# Patient Record
Sex: Female | Born: 1960
Health system: Southern US, Community
[De-identification: ages and names within clinical notes are randomized; demographics above are authoritative.]

## PROBLEM LIST (undated history)

## (undated) DIAGNOSIS — M069 Rheumatoid arthritis, unspecified: Secondary | ICD-10-CM

## (undated) DIAGNOSIS — H409 Unspecified glaucoma: Secondary | ICD-10-CM

## (undated) DIAGNOSIS — G43909 Migraine, unspecified, not intractable, without status migrainosus: Secondary | ICD-10-CM

## (undated) DIAGNOSIS — J309 Allergic rhinitis, unspecified: Secondary | ICD-10-CM

## (undated) DIAGNOSIS — J45909 Unspecified asthma, uncomplicated: Secondary | ICD-10-CM

## (undated) HISTORY — DX: Unspecified glaucoma: H40.9

## (undated) HISTORY — DX: Unspecified asthma, uncomplicated: J45.909

## (undated) HISTORY — PX: APPENDECTOMY: SHX54

## (undated) HISTORY — PX: TONSILLECTOMY: SUR1361

## (undated) HISTORY — DX: Rheumatoid arthritis, unspecified: M06.9

## (undated) HISTORY — DX: Migraine, unspecified, not intractable, without status migrainosus: G43.909

## (undated) HISTORY — DX: Allergic rhinitis, unspecified: J30.9

---

## 2008-12-31 HISTORY — PX: GASTRIC BYPASS: SHX52

## 2009-03-03 ENCOUNTER — Encounter (HOSPITAL_COMMUNITY): Admission: RE | Admit: 2009-03-03 | Discharge: 2009-05-05 | Payer: Self-pay | Admitting: Neurology

## 2009-03-16 ENCOUNTER — Ambulatory Visit (HOSPITAL_COMMUNITY): Admission: RE | Admit: 2009-03-16 | Discharge: 2009-03-16 | Payer: Self-pay | Admitting: General Surgery

## 2009-03-22 ENCOUNTER — Encounter: Admission: RE | Admit: 2009-03-22 | Discharge: 2009-06-20 | Payer: Self-pay | Admitting: General Surgery

## 2009-06-07 ENCOUNTER — Inpatient Hospital Stay (HOSPITAL_COMMUNITY): Admission: RE | Admit: 2009-06-07 | Discharge: 2009-06-09 | Payer: Self-pay | Admitting: General Surgery

## 2009-06-08 ENCOUNTER — Encounter (INDEPENDENT_AMBULATORY_CARE_PROVIDER_SITE_OTHER): Payer: Self-pay | Admitting: General Surgery

## 2009-06-08 ENCOUNTER — Ambulatory Visit: Payer: Self-pay | Admitting: Vascular Surgery

## 2009-08-17 ENCOUNTER — Encounter: Admission: RE | Admit: 2009-08-17 | Discharge: 2009-11-15 | Payer: Self-pay | Admitting: General Surgery

## 2009-12-05 ENCOUNTER — Ambulatory Visit (HOSPITAL_COMMUNITY): Admission: RE | Admit: 2009-12-05 | Discharge: 2009-12-06 | Payer: Self-pay | Admitting: Neurosurgery

## 2010-06-12 IMAGING — CR DG UGI W/ GASTROGRAFIN
2 series · 2 of 2 positions shown · IV contrast (agent unspecified)
Comparison: None

CLINICAL DATA: Postop gastric bypass.

WATER SOLUBLE UPPER GI SERIES
TECHNIQUE: Single-column upper GI series was performed using water
soluble contrast.
Fluoroscopy Time: 2.6 minutes.
Contrast: 50 ml [id number]

[view not recorded (1 of 2)]
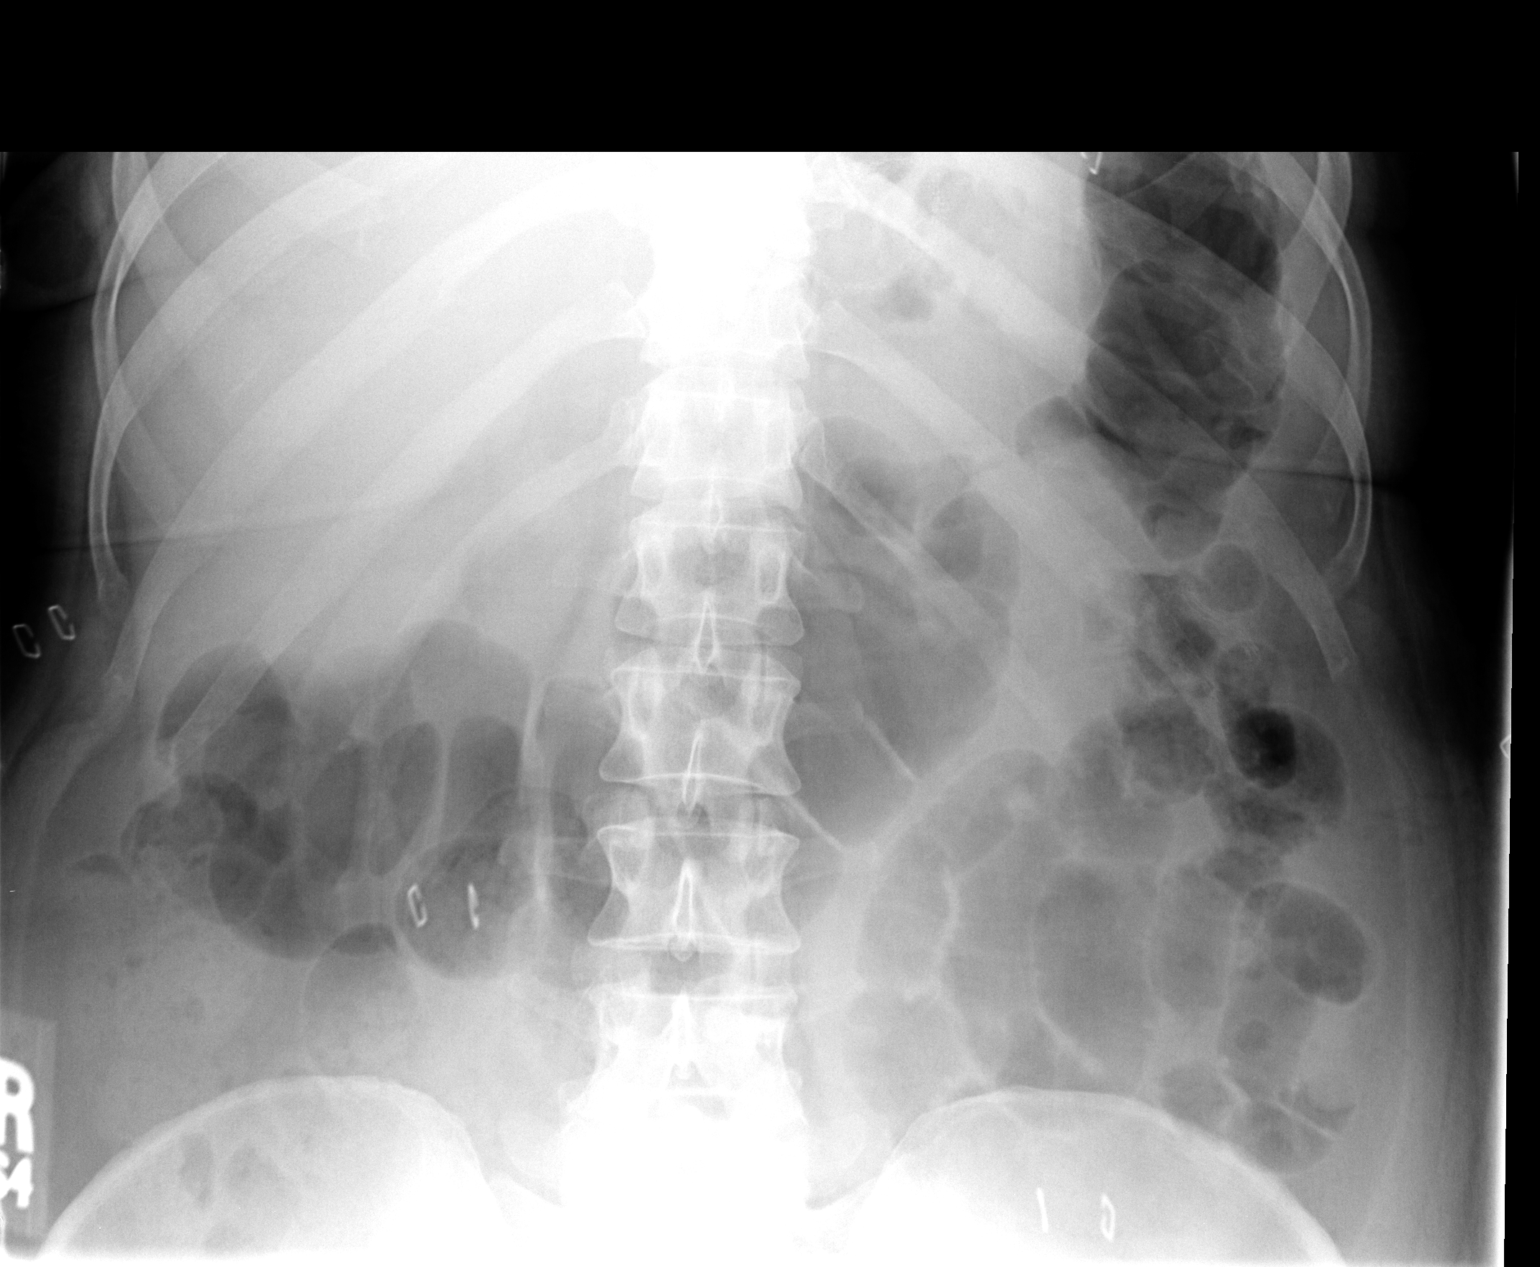

[view not recorded (2 of 2)]
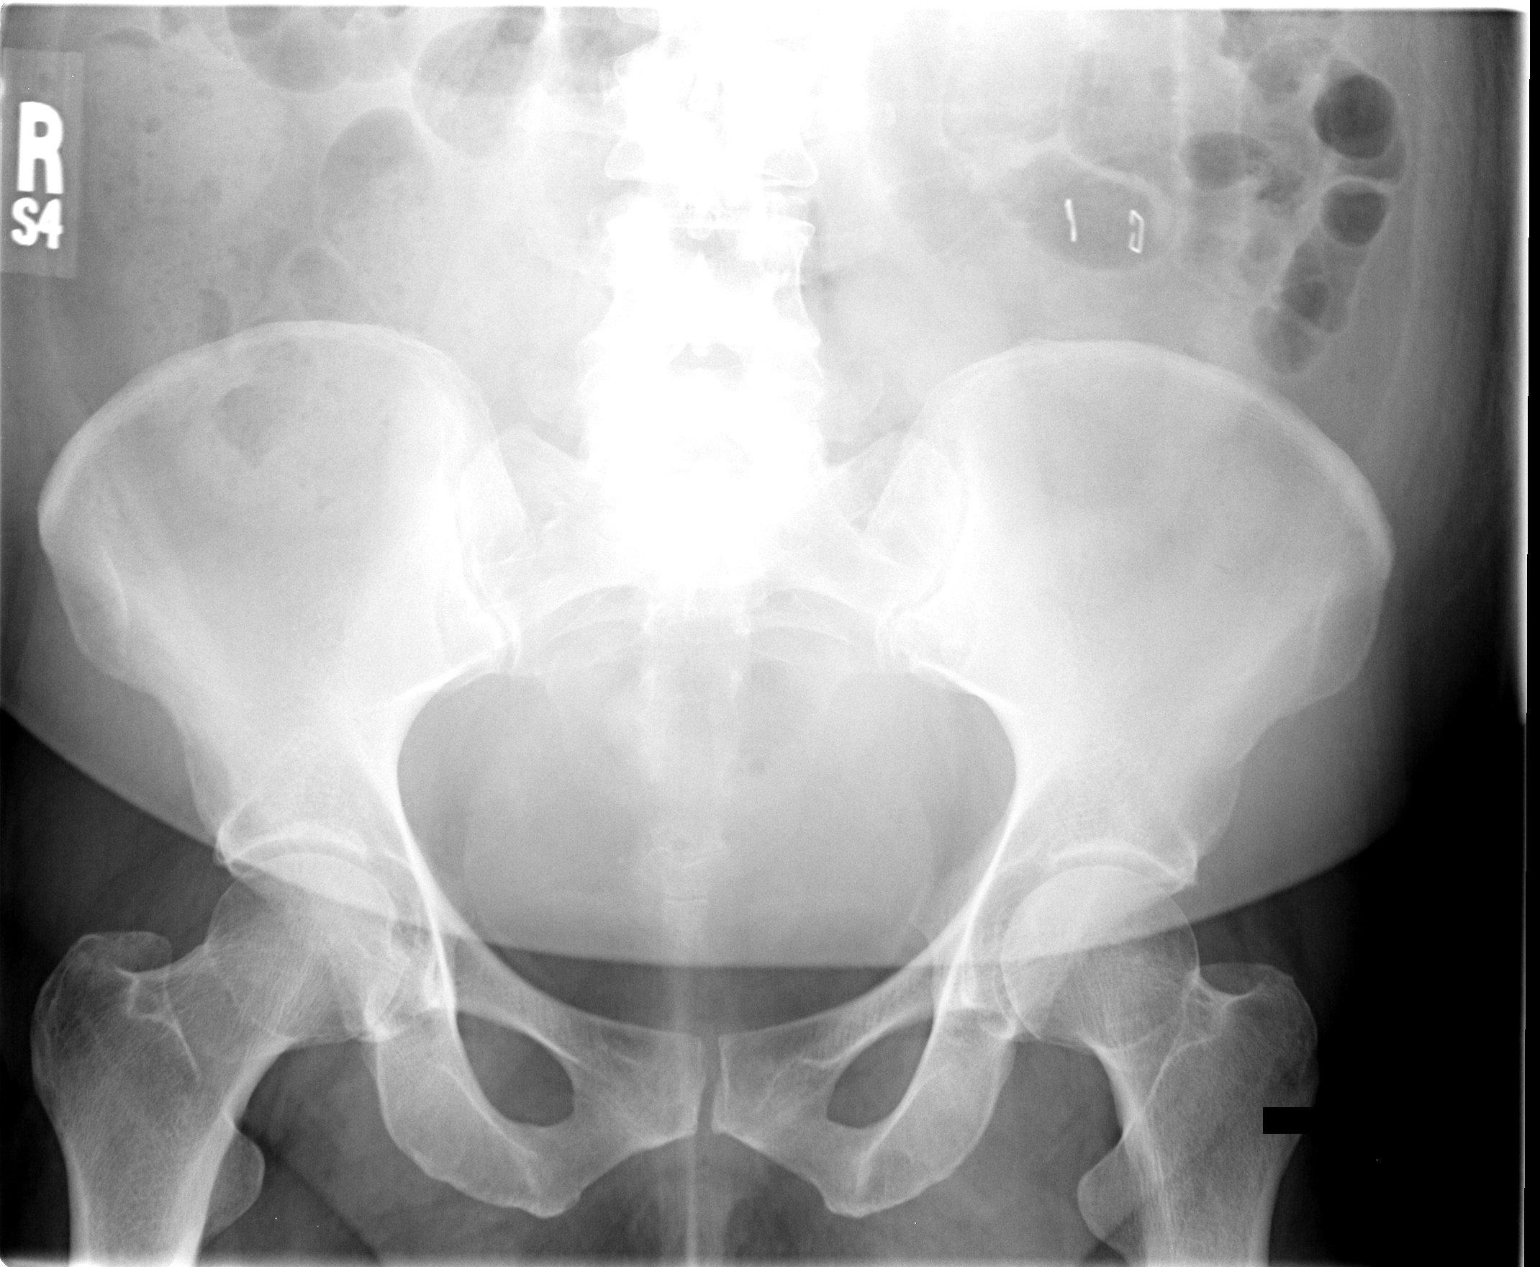

[2 of 2 positions shown; findings below may reference images not displayed]

FINDINGS: Scout film of the abdomen shows mild gaseous distention
of the colon.  Gas in nondistended small bowel.

Following ingestion of water soluble contrast, a small gastric
pouch is identified.  There is prompt emptying into small bowel.
The proximal anastomosis is unremarkable.  No evidence of leak.
Contrast was followed distally in the small bowel into the left
upper pelvis.  No obstruction or leak distally.
IMPRESSION: Unremarkable postop gastric bypass appearance.

## 2011-01-21 ENCOUNTER — Encounter: Payer: Self-pay | Admitting: General Surgery

## 2011-04-03 LAB — CBC
HCT: 39.8 % (ref 36.0–46.0)
Hemoglobin: 13.6 g/dL (ref 12.0–15.0)
MCHC: 34.3 g/dL (ref 30.0–36.0)
MCV: 92.8 fL (ref 78.0–100.0)
Platelets: 271 10*3/uL (ref 150–400)
RBC: 4.28 MIL/uL (ref 3.87–5.11)
RDW: 13.5 % (ref 11.5–15.5)
WBC: 8.2 10*3/uL (ref 4.0–10.5)

## 2011-04-03 LAB — BASIC METABOLIC PANEL
BUN: 11 mg/dL (ref 6–23)
CO2: 26 mEq/L (ref 19–32)
Calcium: 9.4 mg/dL (ref 8.4–10.5)
Chloride: 107 mEq/L (ref 96–112)
Creatinine, Ser: 0.77 mg/dL (ref 0.4–1.2)
GFR calc non Af Amer: >60 mL/min (ref >60)
Glucose, Bld: 100 mg/dL — ABNORMAL HIGH (ref 70–99)
Potassium: 4.4 mEq/L (ref 3.5–5.1)
Sodium: 140 mEq/L (ref 135–145)

## 2011-04-09 LAB — CBC
HCT: 37.2 % (ref 36.0–46.0)
HCT: 39.3 % (ref 36.0–46.0)
HCT: 40.3 % (ref 36.0–46.0)
Hemoglobin: 12.7 g/dL (ref 12.0–15.0)
Hemoglobin: 13.3 g/dL (ref 12.0–15.0)
Hemoglobin: 13.6 g/dL (ref 12.0–15.0)
MCHC: 33.6 g/dL (ref 30.0–36.0)
MCHC: 33.8 g/dL (ref 30.0–36.0)
MCHC: 34 g/dL (ref 30.0–36.0)
MCV: 88.2 fL (ref 78.0–100.0)
MCV: 89.6 fL (ref 78.0–100.0)
MCV: 89.9 fL (ref 78.0–100.0)
Platelets: 238 10*3/uL (ref 150–400)
Platelets: 292 10*3/uL (ref 150–400)
Platelets: 296 10*3/uL (ref 150–400)
RBC: 4.15 MIL/uL (ref 3.87–5.11)
RBC: 4.37 MIL/uL (ref 3.87–5.11)
RBC: 4.57 MIL/uL (ref 3.87–5.11)
RDW: 14.7 % (ref 11.5–15.5)
RDW: 14.9 % (ref 11.5–15.5)
RDW: 15.7 % — ABNORMAL HIGH (ref 11.5–15.5)
WBC: 11.1 10*3/uL — ABNORMAL HIGH (ref 4.0–10.5)
WBC: 15.5 10*3/uL — ABNORMAL HIGH (ref 4.0–10.5)
WBC: 8.4 10*3/uL (ref 4.0–10.5)

## 2011-04-09 LAB — COMPREHENSIVE METABOLIC PANEL
ALT: 21 U/L (ref 0–35)
AST: 36 U/L (ref 0–37)
Albumin: 4.1 g/dL (ref 3.5–5.2)
Alkaline Phosphatase: 81 U/L (ref 39–117)
BUN: 15 mg/dL (ref 6–23)
CO2: 25 mEq/L (ref 19–32)
Calcium: 9.4 mg/dL (ref 8.4–10.5)
Chloride: 107 mEq/L (ref 96–112)
Creatinine, Ser: 0.74 mg/dL (ref 0.4–1.2)
GFR calc non Af Amer: >60 mL/min (ref >60)
Glucose, Bld: 105 mg/dL — ABNORMAL HIGH (ref 70–99)
Potassium: 4.3 mEq/L (ref 3.5–5.1)
Sodium: 139 mEq/L (ref 135–145)
Total Bilirubin: 0.6 mg/dL (ref 0.3–1.2)
Total Protein: 6.9 g/dL (ref 6.0–8.3)

## 2011-04-09 LAB — DIFFERENTIAL
Basophils Absolute: 0 10*3/uL (ref 0.0–0.1)
Basophils Absolute: 0 10*3/uL (ref 0.0–0.1)
Basophils Absolute: 0 10*3/uL (ref 0.0–0.1)
Basophils Relative: 0 % (ref 0–1)
Basophils Relative: 0 % (ref 0–1)
Basophils Relative: 0 % (ref 0–1)
Eosinophils Absolute: 0 10*3/uL (ref 0.0–0.7)
Eosinophils Absolute: 0.1 10*3/uL (ref 0.0–0.7)
Eosinophils Absolute: 0.1 10*3/uL (ref 0.0–0.7)
Eosinophils Relative: 0 % (ref 0–5)
Eosinophils Relative: 1 % (ref 0–5)
Eosinophils Relative: 1 % (ref 0–5)
Lymphocytes Relative: 20 % (ref 12–46)
Lymphocytes Relative: 25 % (ref 12–46)
Lymphocytes Relative: 8 % — ABNORMAL LOW (ref 12–46)
Lymphs Abs: 1.2 10*3/uL (ref 0.7–4.0)
Lymphs Abs: 2.1 10*3/uL (ref 0.7–4.0)
Lymphs Abs: 2.2 10*3/uL (ref 0.7–4.0)
Monocytes Absolute: 0.7 10*3/uL (ref 0.1–1.0)
Monocytes Absolute: 1 10*3/uL (ref 0.1–1.0)
Monocytes Absolute: 1.2 10*3/uL — ABNORMAL HIGH (ref 0.1–1.0)
Monocytes Relative: 8 % (ref 3–12)
Monocytes Relative: 9 % (ref 3–12)
Monocytes Relative: 9 % (ref 3–12)
Neutro Abs: 13.1 10*3/uL — ABNORMAL HIGH (ref 1.7–7.7)
Neutro Abs: 5.5 10*3/uL (ref 1.7–7.7)
Neutro Abs: 7.7 10*3/uL (ref 1.7–7.7)
Neutrophils Relative %: 65 % (ref 43–77)
Neutrophils Relative %: 70 % (ref 43–77)
Neutrophils Relative %: 84 % — ABNORMAL HIGH (ref 43–77)

## 2011-04-09 LAB — HEMOGLOBIN AND HEMATOCRIT, BLOOD
HCT: 37.7 % (ref 36.0–46.0)
HCT: 38.6 % (ref 36.0–46.0)
Hemoglobin: 12.8 g/dL (ref 12.0–15.0)
Hemoglobin: 13.2 g/dL (ref 12.0–15.0)

## 2011-04-09 LAB — PREGNANCY, URINE

## 2011-05-15 NOTE — Op Note (Signed)
[name], Susan Castillo             ACCOUNT NO.:  [id number]   MEDICAL RECORD NO.:  [id number]          PATIENT TYPE:  INP   LOCATION:  0002                         FACILITY:  [facility]   PHYSICIAN:  Sandria Bales. Ezzard Standing, M.D.  DATE OF BIRTH:  Aug 24, 1961   DATE OF PROCEDURE:  06/07/2009  DATE OF DISCHARGE:                               OPERATIVE REPORT   Date of Surgery - 07 June 2009   PREOPERATIVE DIAGNOSIS:  Status post Roux-en-Y gastric bypass.   POSTOPERATIVE DIAGNOSIS:  Status post Roux-en-Y gastric bypass.   PROCEDURE:  Upper esophagogastrostomy.   SURGEON:  Dr. Ezzard Standing.   FIRST ASSISTANT:  None.   ANESTHESIA:  General endotracheal.   BLOOD LOSS:  Minimal.   DESCRIPTION OF PROCEDURE:  [name] is a 50 year old white female with  a BMI of approximately 25 who has undergone a Roux-en-Y gastric bypass  by Dr. Jaclynn Guarneri.  I am doing an upper endoscopy for documentation of  the anastomosis and viability of the pouch.   OPERATIVE NOTE:  With the patient in a supine position under general anesthesia, Dr.  Johna Sheriff manning the laparoscopic, had the small bowel clamped off.  I  passed the Olympus gastroscope down to her gastric pouch without  difficulty.   I identified her esophagogastric junction at about 38 cm.  I identified  the gastrojejunal anastomosis at about 43 cm for an approximately 5-cm  pouch.  The mucosa was viable.  The anastomosis was wide open.  There  was no bleeding.  It was felt to be a normal-appearing pouch and  anastomosis.  Dr. Johna Sheriff inflowed the upper abdomen with saline  looking for bubbles and he saw none.   The scope was then withdrawn.  The esophagus was otherwise unremarkable.  The patient tolerated the procedure well.  Dr. Johna Sheriff will dictate the  Roux-en-Y gastric bypass.      Sandria Bales. Ezzard Standing, M.D.  Electronically Signed     DHN/MEDQ  D:  06/07/2009  T:  06/07/2009  Job:  [id number]   cc:   Lorne Skeens. Hoxworth, M.D.  [address], [address]  [place]  [place] [address]

## 2011-05-15 NOTE — Op Note (Signed)
Susan Castillo, Susan Castillo             ACCOUNT NO.:  [id number]   MEDICAL RECORD NO.:  [id number]          PATIENT TYPE:  INP   LOCATION:  1531                         FACILITY:  [facility]   PHYSICIAN:  Sharlet Salina T. Hoxworth, M.D.DATE OF BIRTH:  11/13/1961   DATE OF PROCEDURE:  06/07/2009  DATE OF DISCHARGE:                               OPERATIVE REPORT   PREOPERATIVE DIAGNOSIS:  Morbid obesity.   POSTOPERATIVE DIAGNOSIS:  Morbid obesity.   SURGICAL PROCEDURE:  Laparoscopic Roux-en-Y gastric bypass.   SURGEON:  Lorne Skeens. Hoxworth, M.D.   ASSISTANT:  Sandria Bales. Ezzard Standing, M.D.   ANESTHESIA:  General.   BRIEF HISTORY:  Ms. Utley is a 50 year old female with progressive  morbid obesity unresponsive to medical management who presents at a BMI  of 35 with comorbidities of hypertension and asthma felt to be weight-  related and GERD.  After extensive discussion of options, detailed  elsewhere we elected proceed with laparoscopic Roux-en-Y gastric bypass  for treatment of her morbid obesity.  She is now brought to the  operating room for this procedure.   DESCRIPTION OF OPERATION:  The patient was brought to operating room,  placed in the supine position on the operating room table and general  endotracheal anesthesia was induced.  The patient received preoperative  IV antibiotics.  PAS were placed.  Lovenox was given subcutaneously.  The abdomen was widely sterilely prepped and draped.  The correct  patient and procedure were verified.  Access was obtained with an 11 mm  OptiView trocar in the left subcostal space without difficulty and  pneumoperitoneum established.  Under direct vision a 12 mm trocar was  placed well laterally in the right upper quadrant, another 12 mm trocar  in the right upper quadrant midclavicular line, another 11 mm trocar  just above and to the left of the umbilicus for the camera port and a 5  mm trocar in the left flank.  The omentum and transverse colon were  elevated.  The ligament Treitz clearly identified.  A 40 cm afferent  limb was then carefully measured and at this point, the small intestine  was divided with a single firing of the 45 mm white load stapler.  The  mesentery was further divided a short distance with the Harmonic  scalpel.  A Penrose drain was sutured to the end of the Roux limb to  identify it.  A 100 cm Roux limb was then carefully measured.  At this  point, a side-to-side anastomosis was created between the afferent limb  and the Roux limb with a single firing of the white load 45 mm stapler.  The staple line was intact without bleeding.  The common enterotomy was  closed with a running 2-0 Vicryl started from either end and  tied  centrally.  The mesenteric defect was then closed with a running 2-0  silk.  Tisseel tissue sealant was used to coat the suture and staple  lines.  Following this, the [facility] retractor was placed through a 5  mm subxiphoid port and the left lobe of liver elevated with excellent  exposure of the stomach  and hiatus.  The angle of His was mobilized with  the Harmonic scalpel.  A 4-5 cm gastric pouch was measured along the  lesser curve.  At this point, the peritoneum along the lesser curve was  dissected along the gastric wall back toward the lesser sac with the  Harmonic scalpel with careful blunt dissection and the lesser sac was  entered.  All tubes were carefully removed from the stomach.  The  initial firing of the gold 60 mm stapler about 3 cm across the lesser  curve was performed.  Further dissection was carried back up toward the  angle of His.  Another firing of the 60 mm blue load stapler up toward  the angle of His almost completed the pouch with a nice tubular 4-5 cm  pouch and a final firing of the blue 45 of mm stapler up through the  angle of His completely separated the pouch from the distal remnant.  The staple line of the distal remnant was oversewn with a running 2-0  silk.   The Roux limb was then brought up in antecolic fashion with the  end of the candy cane facing to the patient's left.  The  gastrojejunostomy was then created with initial posterior row of running  of 2-0 Vicryl.  The Ewald inserted down into the pouch.  Enterotomies  were made in the pouch and the Roux limb with the Harmonic scalpel and a  anastomosis about 2 cm in length was created with a single firing of the  linear 45 mm blue load stapler.  The staple line was intact and without  bleeding.  The common enterotomy was then closed from either end with  running 2-0 Vicryl.  The Ewald tube was inserted down through the  anastomosis and an anterior seromuscular suture of running 2-0 Vicryl  was performed.  The anastomosis appeared nicely patent under no tension  with excellent blood supply.  The Vonita Moss defect was then closed  suturing the edge of the Roux mesentery to the transverse mesocolon with  running 2-0 silk.  Following this, Sandria Bales. Ezzard Standing, M.D. proceeded with  upper endoscopy and with the outlet of the pouch clamped and under  saline irrigation the pouch was tensely distended with air.  There was  no evidence of leak.  The pouch was desufflated.  It was then irrigated  and there was no evidence of bleeding or trocar injury.  Tisseel was  used to coat the suture staple lines of the gastrojejunostomy.  The  Nathanson retractor was removed and all CO2 evacuated.  Trocars were  removed.  The skin incisions were closed with staples.  Sponge, needle  and instrument counts were correct.  The patient was taken to recovery  in good condition.      Lorne Skeens. Hoxworth, M.D.  Electronically Signed     BTH/MEDQ  D:  06/07/2009  T:  06/07/2009  Job:  [id number]

## 2011-11-26 ENCOUNTER — Telehealth (INDEPENDENT_AMBULATORY_CARE_PROVIDER_SITE_OTHER): Payer: Self-pay | Admitting: General Surgery

## 2011-11-26 NOTE — Telephone Encounter (Signed)
11/26/11 recall letter mailed to patient for bariatric surgery follow-up. Adv pt to call [facility] to schedule an appt..cef °

## 2013-01-01 ENCOUNTER — Telehealth (INDEPENDENT_AMBULATORY_CARE_PROVIDER_SITE_OTHER): Payer: Self-pay | Admitting: General Surgery

## 2013-01-01 NOTE — Telephone Encounter (Signed)
12/02/12 mailed recall letter for bariatric surgery follow-up to pt. Advised pt to call [facility] at [phone] to °schedule appt. (lss) ° °

## 2013-01-13 DIAGNOSIS — M5417 Radiculopathy, lumbosacral region: Secondary | ICD-10-CM | POA: Insufficient documentation

## 2013-01-13 DIAGNOSIS — G894 Chronic pain syndrome: Secondary | ICD-10-CM | POA: Insufficient documentation

## 2013-01-13 DIAGNOSIS — M5136 Other intervertebral disc degeneration, lumbar region: Secondary | ICD-10-CM | POA: Insufficient documentation

## 2013-07-14 DIAGNOSIS — M5412 Radiculopathy, cervical region: Secondary | ICD-10-CM | POA: Insufficient documentation

## 2016-01-04 ENCOUNTER — Other Ambulatory Visit: Payer: Self-pay | Admitting: *Deleted

## 2016-01-04 MED ORDER — CYPROHEPTADINE HCL 4 MG PO TABS: ORAL_TABLET | ORAL | Status: DC

## 2016-04-16 ENCOUNTER — Other Ambulatory Visit: Payer: Self-pay | Admitting: *Deleted

## 2016-04-16 MED ORDER — BUDESONIDE-FORMOTEROL FUMARATE 160-4.5 MCG/ACT IN AERO: 2.0000 | INHALATION_SPRAY | Freq: Two times a day (BID) | RESPIRATORY_TRACT | Status: DC

## 2016-04-16 NOTE — Telephone Encounter (Signed)
ONE REFILL SYMBICORT SENT TO [facility]

## 2016-04-16 NOTE — Telephone Encounter (Signed)
PT NEEDS MEDICATION REFILL FOR SYMBICORT. PHARMACY IS [facility]. HAS AN APPT ON MAY 1ST.

## 2016-04-16 NOTE — Telephone Encounter (Signed)
DENIED REFILL SYMBICORT TO [facility] PT NEEDS APPT

## 2016-04-30 ENCOUNTER — Encounter: Payer: Self-pay | Admitting: Allergy and Immunology

## 2016-04-30 ENCOUNTER — Ambulatory Visit (INDEPENDENT_AMBULATORY_CARE_PROVIDER_SITE_OTHER): Payer: BLUE CROSS/BLUE SHIELD | Admitting: Allergy and Immunology

## 2016-04-30 VITALS — BP 128/80 | HR 72 | Resp 12 | Ht 65.0 in | Wt 185.0 lb

## 2016-04-30 DIAGNOSIS — G43109 Migraine with aura, not intractable, without status migrainosus: Secondary | ICD-10-CM

## 2016-04-30 DIAGNOSIS — J4540 Moderate persistent asthma, uncomplicated: Secondary | ICD-10-CM | POA: Diagnosis not present

## 2016-04-30 DIAGNOSIS — H1010 Acute atopic conjunctivitis, unspecified eye: Secondary | ICD-10-CM

## 2016-04-30 DIAGNOSIS — G43909 Migraine, unspecified, not intractable, without status migrainosus: Secondary | ICD-10-CM

## 2016-04-30 DIAGNOSIS — J309 Allergic rhinitis, unspecified: Secondary | ICD-10-CM

## 2016-04-30 MED ORDER — MOMETASONE FUROATE 50 MCG/ACT NA SUSP: NASAL | Status: DC

## 2016-04-30 MED ORDER — BUDESONIDE-FORMOTEROL FUMARATE 160-4.5 MCG/ACT IN AERO: INHALATION_SPRAY | RESPIRATORY_TRACT | Status: DC

## 2016-04-30 MED ORDER — CYPROHEPTADINE HCL 4 MG PO TABS: ORAL_TABLET | ORAL | Status: DC

## 2016-04-30 MED ORDER — ALBUTEROL SULFATE HFA 108 (90 BASE) MCG/ACT IN AERS: INHALATION_SPRAY | RESPIRATORY_TRACT | Status: DC

## 2016-04-30 NOTE — Progress Notes (Signed)
Follow-up Note  Referring Provider: No ref. provider found Primary Provider: Angelina Sheriff., MD Date of Office Visit: 04/30/2016  Subjective:   Susan Castillo (DOB: Sep 14, 1961) is a 55 y.o. female who returns to the [facility] on 04/30/2016 in re-evaluation of the following:  HPI: Chemise returns to this clinic in reevaluation of her asthma and allergic rhinitis and migraine headaches. I last saw her in his clinic in May 2016.   Concerning her asthma, she is done very well while consistently using her Symbicort. During those times of the year she will use her Symbicort one time per day but over the course of the past month she's been using it twice a day. Her bronchodilator requirement is approximately twice a week and she has no nocturnal bronchospastic symptoms. She is not required a systemic steroid in the past year to treat this condition. She did obtain the flu vaccine  Her nose is been doing quite well. She no longer needs to use any Nasonex. She's not had any episodes of sinusitis.  Her sleep is been going well but her cyproheptadine does not appear to be working as well regarding her migraine headaches. She'll wake up several days a week with a headache in the morning. She does not have any snoring nor does she have any symptoms suggest apnea. She is not sleepy during the daytime. She would like to increase her dose of cyproheptadine.    Medication List           budesonide-formoterol 160-4.5 MCG/ACT inhaler  Commonly known as:  SYMBICORT  Inhale 2 puffs into the lungs 2 (two) times daily.     CALCIUM + D PO  Take by mouth.     cyproheptadine 4 MG tablet  Commonly known as:  PERIACTIN  Take 1/2 - 1 tablet every evening as directed     DULoxetine 60 MG capsule  Commonly known as:  CYMBALTA  TK 1 C PO QD     mometasone 50 MCG/ACT nasal spray  Commonly known as:  NASONEX  Place 1 spray into the nose daily.     MULTIVITAMIN ADULT PO  Take  by mouth.     PROAIR HFA 108 (90 Base) MCG/ACT inhaler  Generic drug:  albuterol  Inhale 2 puffs into the lungs every 4 (four) hours as needed for wheezing or shortness of breath.     topiramate 50 MG tablet  Commonly known as:  TOPAMAX  TK 1 T PO BID        Past Medical History  Diagnosis Date  . Asthma     Past Surgical History  Procedure Laterality Date  . Gastric bypass  2010  . Tonsillectomy      Allergies  Allergen Reactions  . Codeine Nausea And Vomiting    Review of systems negative except as noted in HPI / PMHx or noted below:  Review of Systems  Constitutional: Negative.   HENT: Negative.   Eyes: Negative.   Respiratory: Negative.   Cardiovascular: Negative.   Gastrointestinal: Negative.   Genitourinary: Negative.   Musculoskeletal: Negative.   Skin: Negative.   Neurological: Negative.   Endo/Heme/Allergies: Negative.   Psychiatric/Behavioral: Negative.      Objective:   Filed Vitals:   04/30/16 1619  BP: 128/80  Pulse: 72  Resp: 12   Height: 5\' 5"  (165.1 cm)  Weight: 185 lb (83.915 kg)   Physical Exam  Constitutional: She is well-developed, well-nourished, and in no distress.  HENT:  Head: Normocephalic.  Right Ear: Tympanic membrane, external ear and ear canal normal.  Left Ear: Tympanic membrane, external ear and ear canal normal.  Nose: Nose normal. No mucosal edema or rhinorrhea.  Mouth/Throat: Uvula is midline, oropharynx is clear and moist and mucous membranes are normal. No oropharyngeal exudate.  Hearing aids  Eyes: Conjunctivae are normal.  Neck: Trachea normal. No tracheal tenderness present. No tracheal deviation present. No thyromegaly present.  Cardiovascular: Normal rate, regular rhythm, S1 normal, S2 normal and normal heart sounds.   No murmur heard. Pulmonary/Chest: Breath sounds normal. No stridor. No respiratory distress. She has no wheezes. She has no rales.  Musculoskeletal: She exhibits no edema.    Lymphadenopathy:       Head (right side): No tonsillar adenopathy present.       Head (left side): No tonsillar adenopathy present.    She has no cervical adenopathy.  Neurological: She is alert. Gait normal.  Skin: No rash noted. She is not diaphoretic. No erythema. Nails show no clubbing.  Psychiatric: Mood and affect normal.    Diagnostics:    Spirometry was performed and demonstrated an FEV1 of 2.71 at 97 % of predicted.  The patient had an Asthma Control Test with the following results: ACT Total Score: 19.    Assessment and Plan:   1. Asthma, moderate persistent, well-controlled   2. Allergic rhinoconjunctivitis   3. Migraine syndrome     1. Increase Periactin to 6 mg bedtime (1-1/2 tablets)  2. Continue Symbicort 160 - 2 inhalations twice a day  3. Continue Nasonex one spray each nostril once a day  4. Continue ProAir HFA and antihistamine if needed  5. Return to clinic in 6 months or earlier if problem  Overall Adilen respiratory tract has been doing quite well and I see no need for changing her medical therapy at this point in time. However, she has had a few more headaches recently. I'll assume that this is still a migraine syndrome and treat her with an increased dose of Periactin and she will contact me over the course the next several weeks noting her response to this approach. If she still continues to have headaches and she will need to go see a neurologist. If she does well I will see her back in this clinic in 6 months.  Allena Katz, MD [place]

## 2016-04-30 NOTE — Patient Instructions (Addendum)
  1. Increase Periactin to 6 mg bedtime (1-1/2 tablets)  2. Continue Symbicort 160 - 2 inhalations twice a day  3. Continue Nasonex one spray each nostril once a day  4. Continue ProAir HFA and antihistamine if needed  5. Return to clinic in 6 months or earlier if problem

## 2016-05-17 ENCOUNTER — Other Ambulatory Visit: Payer: Self-pay | Admitting: *Deleted

## 2016-05-17 MED ORDER — BUDESONIDE-FORMOTEROL FUMARATE 160-4.5 MCG/ACT IN AERO: INHALATION_SPRAY | RESPIRATORY_TRACT | Status: DC

## 2016-05-25 ENCOUNTER — Other Ambulatory Visit: Payer: Self-pay | Admitting: *Deleted

## 2016-05-25 MED ORDER — CYPROHEPTADINE HCL 4 MG PO TABS: ORAL_TABLET | ORAL | Status: DC

## 2016-10-09 ENCOUNTER — Other Ambulatory Visit: Payer: Self-pay | Admitting: Allergy and Immunology

## 2016-11-05 ENCOUNTER — Ambulatory Visit: Payer: BLUE CROSS/BLUE SHIELD | Admitting: Allergy and Immunology

## 2016-11-12 DIAGNOSIS — Z79899 Other long term (current) drug therapy: Secondary | ICD-10-CM | POA: Diagnosis not present

## 2016-11-12 DIAGNOSIS — Z01419 Encounter for gynecological examination (general) (routine) without abnormal findings: Secondary | ICD-10-CM | POA: Diagnosis not present

## 2016-11-12 DIAGNOSIS — Z0000 Encounter for general adult medical examination without abnormal findings: Secondary | ICD-10-CM | POA: Diagnosis not present

## 2016-11-12 DIAGNOSIS — Z23 Encounter for immunization: Secondary | ICD-10-CM | POA: Diagnosis not present

## 2016-11-19 ENCOUNTER — Encounter (HOSPITAL_COMMUNITY): Payer: Self-pay

## 2016-12-09 ENCOUNTER — Other Ambulatory Visit: Payer: Self-pay | Admitting: Allergy and Immunology

## 2016-12-13 DIAGNOSIS — L703 Acne tropica: Secondary | ICD-10-CM | POA: Diagnosis not present

## 2016-12-17 DIAGNOSIS — H903 Sensorineural hearing loss, bilateral: Secondary | ICD-10-CM | POA: Diagnosis not present

## 2016-12-24 ENCOUNTER — Other Ambulatory Visit: Payer: Self-pay | Admitting: Allergy and Immunology

## 2017-01-07 ENCOUNTER — Ambulatory Visit (INDEPENDENT_AMBULATORY_CARE_PROVIDER_SITE_OTHER): Payer: BLUE CROSS/BLUE SHIELD | Admitting: Allergy and Immunology

## 2017-01-07 ENCOUNTER — Encounter: Payer: Self-pay | Admitting: Allergy and Immunology

## 2017-01-07 VITALS — BP 120/80 | HR 84 | Resp 16

## 2017-01-07 DIAGNOSIS — J309 Allergic rhinitis, unspecified: Secondary | ICD-10-CM

## 2017-01-07 DIAGNOSIS — J4540 Moderate persistent asthma, uncomplicated: Secondary | ICD-10-CM

## 2017-01-07 DIAGNOSIS — H1010 Acute atopic conjunctivitis, unspecified eye: Secondary | ICD-10-CM

## 2017-01-07 DIAGNOSIS — G43909 Migraine, unspecified, not intractable, without status migrainosus: Secondary | ICD-10-CM

## 2017-01-07 MED ORDER — BUDESONIDE-FORMOTEROL FUMARATE 160-4.5 MCG/ACT IN AERO: INHALATION_SPRAY | RESPIRATORY_TRACT | 5 refills | Status: DC

## 2017-01-07 MED ORDER — CYPROHEPTADINE HCL 4 MG PO TABS: ORAL_TABLET | ORAL | 5 refills | Status: DC

## 2017-01-07 MED ORDER — ALBUTEROL SULFATE HFA 108 (90 BASE) MCG/ACT IN AERS: INHALATION_SPRAY | RESPIRATORY_TRACT | 1 refills | Status: DC

## 2017-01-07 MED ORDER — MOMETASONE FUROATE 50 MCG/ACT NA SUSP: NASAL | 5 refills | Status: DC

## 2017-01-07 NOTE — Patient Instructions (Addendum)
  1. Continue Periactin to 6 mg bedtime (1-1/2 tablets)  2. Continue Symbicort 160 - 2 inhalations twice a day  3. Continue Nasonex one spray each nostril once a day  4. Continue ProAir HFA and antihistamine if needed  5. Return to clinic in 6 months or earlier if problem

## 2017-01-07 NOTE — Progress Notes (Signed)
Follow-up Note  Referring Provider: Angelina Sheriff, MD Primary Provider: Angelina Sheriff., MD Date of Office Visit: 01/07/2017  Subjective:   Susan Castillo (DOB: 29-Jan-1961) is a 56 y.o. female who returns to the [facility] on 01/07/2017 in re-evaluation of the following:  HPI: Susan Castillo returns to this clinic in reevaluation of her asthma and allergic rhinitis and migraine headaches. I last saw her in his clinic in May 2017.  During the interval her atopic respiratory disease has been under excellent control while using her Symbicort and Nasonex. She has not required a systemic steroid or antibiotics to treat her respiratory tract issue. She only uses a short-acting bronchodilator prior to exercise and cold.  Her headaches have been basically nonexistent while using 6 mg of Periactin every night.  She did obtain the flu vaccine this year.  Allergies as of 01/07/2017      Reactions   Codeine Nausea And Vomiting      Medication List      albuterol 108 (90 Base) MCG/ACT inhaler Commonly known as:  PROAIR HFA Inhale two puffs every 4-6 hours if needed for cough or wheeze   budesonide-formoterol 160-4.5 MCG/ACT inhaler Commonly known as:  SYMBICORT Inhale two puffs twice daily to prevent cough or wheeze. Rinse, gargle, and spit after use.   CALCIUM + D PO Take by mouth.   cyproheptadine 4 MG tablet Commonly known as:  PERIACTIN Take one and one half tablets daily as directed   DULoxetine 60 MG capsule Commonly known as:  CYMBALTA TK 1 C PO QD   mometasone 50 MCG/ACT nasal spray Commonly known as:  NASONEX Use one spray in each nostril once daily as directed   MULTIVITAMIN ADULT PO Take by mouth.   topiramate 50 MG tablet Commonly known as:  TOPAMAX TK 1 T PO BID       Past Medical History:  Diagnosis Date  . Allergic rhinitis   . Asthma     Past Surgical History:  Procedure Laterality Date  . GASTRIC BYPASS  2010  .  TONSILLECTOMY      Review of systems negative except as noted in HPI / PMHx or noted below:  Review of Systems  Constitutional: Negative.   HENT: Negative.   Eyes: Negative.   Respiratory: Negative.   Cardiovascular: Negative.   Gastrointestinal: Negative.   Genitourinary: Negative.   Musculoskeletal: Negative.   Skin: Negative.   Neurological: Negative.   Endo/Heme/Allergies: Negative.   Psychiatric/Behavioral: Negative.      Objective:   Vitals:   01/07/17 1732  BP: 120/80  Pulse: 84  Resp: 16          Physical Exam  Constitutional: She is well-developed, well-nourished, and in no distress.  HENT:  Head: Normocephalic.  Right Ear: Tympanic membrane, external ear and ear canal normal.  Left Ear: Tympanic membrane, external ear and ear canal normal.  Nose: Nose normal. No mucosal edema or rhinorrhea.  Mouth/Throat: Uvula is midline, oropharynx is clear and moist and mucous membranes are normal. No oropharyngeal exudate.  Eyes: Conjunctivae are normal.  Neck: Trachea normal. No tracheal tenderness present. No tracheal deviation present. No thyromegaly present.  Cardiovascular: Normal rate, regular rhythm, S1 normal, S2 normal and normal heart sounds.   No murmur heard. Pulmonary/Chest: Breath sounds normal. No stridor. No respiratory distress. She has no wheezes. She has no rales.  Musculoskeletal: She exhibits no edema.  Lymphadenopathy:       Head (  right side): No tonsillar adenopathy present.       Head (left side): No tonsillar adenopathy present.    She has no cervical adenopathy.  Neurological: She is alert. Gait normal.  Skin: No rash noted. She is not diaphoretic. No erythema. Nails show no clubbing.  Psychiatric: Mood and affect normal.    Diagnostics:    Spirometry was performed and demonstrated an FEV1 of 2.68 at 96 % of predicted.  The patient had an Asthma Control Test with the following results: ACT Total Score: 21.    Assessment and Plan:    1. Asthma, moderate persistent, well-controlled   2. Allergic rhinoconjunctivitis   3. Migraine syndrome     1. Continue Periactin to 6 mg bedtime (1-1/2 tablets)  2. Continue Symbicort 160 - 2 inhalations twice a day  3. Continue Nasonex one spray each nostril once a day  4. Continue ProAir HFA and antihistamine if needed  5. Return to clinic in 6 months or earlier if problem  Deron appears to be doing quite well regarding her atopic respiratory disease and her migraine headaches on her current medical therapy. She'll continue on this plan of anti-inflammatory medications for her respiratory tract and a preventative medication for her migraines and I will see her back in this clinic in 6 months or earlier if there is a problem.  Allena Katz, MD [place]

## 2017-01-10 DIAGNOSIS — K602 Anal fissure, unspecified: Secondary | ICD-10-CM | POA: Diagnosis not present

## 2017-04-10 DIAGNOSIS — F988 Other specified behavioral and emotional disorders with onset usually occurring in childhood and adolescence: Secondary | ICD-10-CM | POA: Diagnosis not present

## 2017-04-10 DIAGNOSIS — E7800 Pure hypercholesterolemia, unspecified: Secondary | ICD-10-CM | POA: Diagnosis not present

## 2017-04-10 DIAGNOSIS — Z6833 Body mass index (BMI) 33.0-33.9, adult: Secondary | ICD-10-CM | POA: Diagnosis not present

## 2017-04-10 DIAGNOSIS — Z79899 Other long term (current) drug therapy: Secondary | ICD-10-CM | POA: Diagnosis not present

## 2017-04-10 DIAGNOSIS — Z0000 Encounter for general adult medical examination without abnormal findings: Secondary | ICD-10-CM | POA: Diagnosis not present

## 2017-04-11 DIAGNOSIS — Z1231 Encounter for screening mammogram for malignant neoplasm of breast: Secondary | ICD-10-CM | POA: Diagnosis not present

## 2017-05-22 DIAGNOSIS — H40013 Open angle with borderline findings, low risk, bilateral: Secondary | ICD-10-CM | POA: Diagnosis not present

## 2017-05-22 DIAGNOSIS — H524 Presbyopia: Secondary | ICD-10-CM | POA: Diagnosis not present

## 2017-06-01 DIAGNOSIS — J019 Acute sinusitis, unspecified: Secondary | ICD-10-CM | POA: Diagnosis not present

## 2017-06-13 DIAGNOSIS — F331 Major depressive disorder, recurrent, moderate: Secondary | ICD-10-CM | POA: Diagnosis not present

## 2017-06-13 DIAGNOSIS — F419 Anxiety disorder, unspecified: Secondary | ICD-10-CM | POA: Diagnosis not present

## 2017-07-08 ENCOUNTER — Encounter: Payer: Self-pay | Admitting: Allergy and Immunology

## 2017-07-08 ENCOUNTER — Ambulatory Visit (INDEPENDENT_AMBULATORY_CARE_PROVIDER_SITE_OTHER): Payer: BLUE CROSS/BLUE SHIELD | Admitting: Allergy and Immunology

## 2017-07-08 VITALS — BP 120/72 | HR 82 | Resp 16 | Ht 64.0 in | Wt 195.0 lb

## 2017-07-08 DIAGNOSIS — L308 Other specified dermatitis: Secondary | ICD-10-CM

## 2017-07-08 DIAGNOSIS — J3089 Other allergic rhinitis: Secondary | ICD-10-CM

## 2017-07-08 DIAGNOSIS — G43909 Migraine, unspecified, not intractable, without status migrainosus: Secondary | ICD-10-CM | POA: Diagnosis not present

## 2017-07-08 DIAGNOSIS — J4540 Moderate persistent asthma, uncomplicated: Secondary | ICD-10-CM | POA: Diagnosis not present

## 2017-07-08 DIAGNOSIS — L989 Disorder of the skin and subcutaneous tissue, unspecified: Secondary | ICD-10-CM

## 2017-07-08 MED ORDER — MOMETASONE FUROATE 0.1 % EX OINT: TOPICAL_OINTMENT | Freq: Two times a day (BID) | CUTANEOUS | 5 refills | Status: DC | PRN

## 2017-07-08 NOTE — Progress Notes (Signed)
Follow-up Note  Referring Provider: Angelina Sheriff, MD Primary Provider: Angelina Sheriff, MD Date of Office Visit: 07/08/2017  Subjective:   Susan Castillo (DOB: 1961-10-03) is a 56 y.o. female who returns to the [facility] [place] on 07/08/2017 in re-evaluation of the following:  HPI: Susan Castillo returns to this clinic in reevaluation of her asthma and allergic rhinitis and migraine headache and a new onset dermatitis. I last saw her in this clinic January 2018.  Asthma has been under excellent control and she rarely uses a short acting bronchodilator and has not required the administration of systemic steroid for a exacerbation.  She has done very well with her nose and has not required an antibiotic up until May at which point in time she contracted an upper respiratory tract infection that was treated successfully with an antibiotic. Otherwise, she has no significant upper airway symptoms.  Headaches are under excellent control with Periactin 6 mg daily.  This past Saturday she developed a itchy dermatitis on her legs that was extremely red for which she went to the urgent care center and it was suggested that she use over-the-counter hydrocortisone which she has been doing 3 times per day and almost all the redness has resolved but has left behind a different type of rash. She has no associated systemic or constitutional symptoms or obvious trigger giving rise to this issue.  Allergies as of 07/08/2017      Reactions   Codeine Nausea And Vomiting      Medication List      albuterol 108 (90 Base) MCG/ACT inhaler Commonly known as:  PROAIR HFA Inhale two puffs every 4-6 hours if needed for cough or wheeze   budesonide-formoterol 160-4.5 MCG/ACT inhaler Commonly known as:  SYMBICORT Inhale two puffs twice daily to prevent cough or wheeze. Rinse, gargle, and spit after use.   CALCIUM + D PO Take by mouth.   cyproheptadine 4 MG tablet Commonly known as:   PERIACTIN Take one and one half tablets daily as directed   DULoxetine 60 MG capsule Commonly known as:  CYMBALTA Take 1 capsule by mouth every morning   DULoxetine 30 MG capsule Commonly known as:  CYMBALTA Take 30 mg by mouth at bedtime.   mometasone 50 MCG/ACT nasal spray Commonly known as:  NASONEX Use one spray in each nostril once daily as directed   MULTIVITAMIN ADULT PO Take by mouth.   topiramate 50 MG tablet Commonly known as:  TOPAMAX TK 1 T PO BID       Past Medical History:  Diagnosis Date  . Allergic rhinitis   . Asthma     Past Surgical History:  Procedure Laterality Date  . GASTRIC BYPASS  2010  . TONSILLECTOMY      Review of systems negative except as noted in HPI / PMHx or noted below:  Review of Systems  Constitutional: Negative.   HENT: Negative.   Eyes: Negative.   Respiratory: Negative.   Cardiovascular: Negative.   Gastrointestinal: Negative.   Genitourinary: Negative.   Musculoskeletal: Negative.   Skin: Negative.   Neurological: Negative.   Endo/Heme/Allergies: Negative.   Psychiatric/Behavioral: Negative.      Objective:   Vitals:   07/08/17 1708  BP: 120/72  Pulse: 82  Resp: 16   Height: 5\' 4"  (162.6 cm)  Weight: 195 lb (88.5 kg)   Physical Exam  Constitutional: She is well-developed, well-nourished, and in no distress.  HENT:  Head: Normocephalic.  Right Ear: Tympanic membrane, external ear and ear canal normal.  Left Ear: Tympanic membrane, external ear and ear canal normal.  Nose: Nose normal. No mucosal edema or rhinorrhea.  Mouth/Throat: Uvula is midline, oropharynx is clear and moist and mucous membranes are normal. No oropharyngeal exudate.  Eyes: Conjunctivae are normal.  Neck: Trachea normal. No tracheal tenderness present. No tracheal deviation present. No thyromegaly present.  Cardiovascular: Normal rate, regular rhythm, S1 normal, S2 normal and normal heart sounds.   No murmur heard. Pulmonary/Chest:  Breath sounds normal. No stridor. No respiratory distress. She has no wheezes. She has no rales.  Musculoskeletal: She exhibits no edema.  Lymphadenopathy:       Head (right side): No tonsillar adenopathy present.       Head (left side): No tonsillar adenopathy present.    She has no cervical adenopathy.  Neurological: She is alert. Gait normal.  Skin: Rash (multiple petechiae and lateral aspect of shins bilaterally. No induration. No erythema.) noted. She is not diaphoretic. No erythema. Nails show no clubbing.  Psychiatric: Mood and affect normal.    Diagnostics:    Spirometry was performed and demonstrated an FEV1 of 2.76 at 102 % of predicted.  The patient had an Asthma Control Test with the following results: ACT Total Score: 19.    Assessment and Plan:   1. Asthma, moderate persistent, well-controlled   2. Other allergic rhinitis   3. Migraine syndrome   4. Inflammatory dermatosis     1. Continue Periactin 6 mg bedtime (1-1/2 tablets)  2. Continue Symbicort 160 - 2 inhalations twice a day  3. Continue Nasonex one spray each nostril once a day  4. Continue ProAir HFA and antihistamine if needed  5. Start mometasone 0.1% ointment to rash two times a day until resolved  6. Return to clinic in 6 months or earlier if problem  7. Obtain fall flu vaccine  [name] appears to be doing very well regarding her respiratory tract and her headaches and we will keep her on the plan mentioned above. I'm going to have her use a moderate potency topical steroid to help with her remaining inflammation affecting her lower extremities. I think the petechiae are probably a sign that she abraded this area pretty significantly when it was itchy. The cause of her inflammatory dermatitis is unknown but I'm not going to have her undergo any further evaluation at this point concerning this issue unless of course it is recurrent. I will see her back in this clinic in 6 months or earlier if there is  a problem.  Allena Katz, MD Allergy / Immunology [place]

## 2017-07-08 NOTE — Patient Instructions (Addendum)
  1. Continue Periactin 6 mg bedtime (1-1/2 tablets)  2. Continue Symbicort 160 - 2 inhalations twice a day  3. Continue Nasonex one spray each nostril once a day  4. Continue ProAir HFA and antihistamine if needed  5. Start mometasone 0.1% ointment to rash two times a day until resolved  6. Return to clinic in 6 months or earlier if problem  7. Obtain fall flu vaccine

## 2017-07-10 DIAGNOSIS — H40013 Open angle with borderline findings, low risk, bilateral: Secondary | ICD-10-CM | POA: Diagnosis not present

## 2017-07-11 DIAGNOSIS — F331 Major depressive disorder, recurrent, moderate: Secondary | ICD-10-CM | POA: Diagnosis not present

## 2017-07-29 DIAGNOSIS — M792 Neuralgia and neuritis, unspecified: Secondary | ICD-10-CM | POA: Diagnosis not present

## 2017-07-31 ENCOUNTER — Other Ambulatory Visit: Payer: Self-pay | Admitting: Allergy and Immunology

## 2017-08-05 ENCOUNTER — Other Ambulatory Visit: Payer: Self-pay

## 2017-08-05 DIAGNOSIS — M549 Dorsalgia, unspecified: Secondary | ICD-10-CM | POA: Diagnosis not present

## 2017-08-05 DIAGNOSIS — L300 Nummular dermatitis: Secondary | ICD-10-CM | POA: Diagnosis not present

## 2017-08-05 DIAGNOSIS — Z6833 Body mass index (BMI) 33.0-33.9, adult: Secondary | ICD-10-CM | POA: Diagnosis not present

## 2017-08-05 DIAGNOSIS — G479 Sleep disorder, unspecified: Secondary | ICD-10-CM | POA: Diagnosis not present

## 2017-08-05 DIAGNOSIS — L570 Actinic keratosis: Secondary | ICD-10-CM | POA: Diagnosis not present

## 2017-08-05 DIAGNOSIS — D485 Neoplasm of uncertain behavior of skin: Secondary | ICD-10-CM | POA: Diagnosis not present

## 2017-08-05 DIAGNOSIS — G8929 Other chronic pain: Secondary | ICD-10-CM | POA: Diagnosis not present

## 2017-08-05 MED ORDER — CYPROHEPTADINE HCL 4 MG PO TABS: ORAL_TABLET | ORAL | 1 refills | Status: DC

## 2017-08-05 MED ORDER — MOMETASONE FUROATE 50 MCG/ACT NA SUSP: NASAL | 1 refills | Status: DC

## 2017-08-05 MED ORDER — BUDESONIDE-FORMOTEROL FUMARATE 160-4.5 MCG/ACT IN AERO: INHALATION_SPRAY | RESPIRATORY_TRACT | 1 refills | Status: DC

## 2017-08-07 DIAGNOSIS — F331 Major depressive disorder, recurrent, moderate: Secondary | ICD-10-CM | POA: Diagnosis not present

## 2017-08-13 DIAGNOSIS — M549 Dorsalgia, unspecified: Secondary | ICD-10-CM | POA: Diagnosis not present

## 2017-08-13 DIAGNOSIS — M5442 Lumbago with sciatica, left side: Secondary | ICD-10-CM | POA: Diagnosis not present

## 2017-08-13 DIAGNOSIS — Z6830 Body mass index (BMI) 30.0-30.9, adult: Secondary | ICD-10-CM | POA: Diagnosis not present

## 2017-08-13 DIAGNOSIS — G8929 Other chronic pain: Secondary | ICD-10-CM | POA: Diagnosis not present

## 2017-08-19 ENCOUNTER — Other Ambulatory Visit: Payer: Self-pay | Admitting: Neurosurgery

## 2017-08-19 DIAGNOSIS — G8929 Other chronic pain: Secondary | ICD-10-CM

## 2017-08-19 DIAGNOSIS — M5442 Lumbago with sciatica, left side: Principal | ICD-10-CM

## 2017-08-22 DIAGNOSIS — G43909 Migraine, unspecified, not intractable, without status migrainosus: Secondary | ICD-10-CM | POA: Diagnosis not present

## 2017-08-22 DIAGNOSIS — M791 Myalgia: Secondary | ICD-10-CM | POA: Diagnosis not present

## 2017-08-22 DIAGNOSIS — R5383 Other fatigue: Secondary | ICD-10-CM | POA: Diagnosis not present

## 2017-08-30 ENCOUNTER — Ambulatory Visit
Admission: RE | Admit: 2017-08-30 | Discharge: 2017-08-30 | Disposition: A | Discharge status: Home / Self Care | Payer: BLUE CROSS/BLUE SHIELD | Source: Ambulatory Visit | Attending: Neurosurgery | Admitting: Neurosurgery

## 2017-08-30 DIAGNOSIS — M5442 Lumbago with sciatica, left side: Principal | ICD-10-CM

## 2017-08-30 DIAGNOSIS — G8929 Other chronic pain: Secondary | ICD-10-CM

## 2017-08-30 DIAGNOSIS — M5126 Other intervertebral disc displacement, lumbar region: Secondary | ICD-10-CM | POA: Diagnosis not present

## 2017-08-30 MED ORDER — DIAZEPAM 5 MG PO TABS
10.0000 mg | ORAL_TABLET | Freq: Once | ORAL | Status: AC
  Administered 2017-08-30: 10 mg via ORAL

## 2017-08-30 MED ORDER — IOPAMIDOL (ISOVUE-M 200) INJECTION 41%
15.0000 mL | Freq: Once | INTRAMUSCULAR | Status: AC
  Administered 2017-08-30: 15 mL via INTRATHECAL

## 2017-08-30 MED ORDER — ONDANSETRON HCL 4 MG/2ML IJ SOLN: 4.0000 mg | Freq: Four times a day (QID) | INTRAMUSCULAR | Status: DC | PRN

## 2017-08-30 MED ORDER — MEPERIDINE HCL 100 MG/ML IJ SOLN
75.0000 mg | Freq: Once | INTRAMUSCULAR | Status: AC
  Administered 2017-08-30: 75 mg via INTRAMUSCULAR

## 2017-08-30 MED ORDER — ONDANSETRON HCL 4 MG/2ML IJ SOLN
4.0000 mg | Freq: Once | INTRAMUSCULAR | Status: AC
  Administered 2017-08-30: 4 mg via INTRAMUSCULAR

## 2017-08-30 NOTE — Discharge Instructions (Signed)
Myelogram Discharge Instructions  1. Go home and rest quietly for the next 24 hours.  It is important to lie flat for the next 24 hours.  Get up only to go to the restroom.  You may lie in the bed or on a couch on your back, your stomach, your left side or your right side.  You may have one pillow under your head.  You may have pillows between your knees while you are on your side or under your knees while you are on your back.  2. DO NOT drive today.  Recline the seat as far back as it will go, while still wearing your seat belt, on the way home.  3. You may get up to go to the bathroom as needed.  You may sit up for 10 minutes to eat.  You may resume your normal diet and medications unless otherwise indicated.  Drink lots of extra fluids today and tomorrow.  4. The incidence of headache, nausea, or vomiting is about 5% (one in 20 patients).  If you develop a headache, lie flat and drink plenty of fluids until the headache goes away.  Caffeinated beverages may be helpful.  If you develop severe nausea and vomiting or a headache that does not go away with flat bed rest, call [phone].  5. You may resume normal activities after your 24 hours of bed rest is over; however, do not exert yourself strongly or do any heavy lifting tomorrow. If when you get up you have a headache when standing, go back to bed and force fluids for another 24 hours.  6. Call your physician for a follow-up appointment.  The results of your myelogram will be sent directly to your physician by the following day.  7. If you have any questions or if complications develop after you arrive home, please call [phone].  Discharge instructions have been explained to the patient.  The patient, or the person responsible for the patient, fully understands these instructions.      May resume Abilify and Cymbalta on Sept. 1, 2018, after 9:30 am.

## 2017-08-30 NOTE — Progress Notes (Signed)
Patient states she has been off Abilify and Cymbalta for the past two days.  Brita Romp, RN

## 2017-09-10 DIAGNOSIS — H40013 Open angle with borderline findings, low risk, bilateral: Secondary | ICD-10-CM | POA: Diagnosis not present

## 2017-10-09 DIAGNOSIS — M25571 Pain in right ankle and joints of right foot: Secondary | ICD-10-CM | POA: Diagnosis not present

## 2017-10-09 DIAGNOSIS — R5383 Other fatigue: Secondary | ICD-10-CM | POA: Diagnosis not present

## 2017-10-09 DIAGNOSIS — M179 Osteoarthritis of knee, unspecified: Secondary | ICD-10-CM | POA: Diagnosis not present

## 2017-10-09 DIAGNOSIS — M791 Myalgia, unspecified site: Secondary | ICD-10-CM | POA: Diagnosis not present

## 2017-10-09 DIAGNOSIS — M79671 Pain in right foot: Secondary | ICD-10-CM | POA: Diagnosis not present

## 2017-10-09 DIAGNOSIS — M25561 Pain in right knee: Secondary | ICD-10-CM | POA: Diagnosis not present

## 2017-10-14 DIAGNOSIS — M79643 Pain in unspecified hand: Secondary | ICD-10-CM | POA: Diagnosis not present

## 2017-10-14 DIAGNOSIS — M25569 Pain in unspecified knee: Secondary | ICD-10-CM | POA: Diagnosis not present

## 2017-10-14 DIAGNOSIS — M7989 Other specified soft tissue disorders: Secondary | ICD-10-CM | POA: Diagnosis not present

## 2017-10-14 DIAGNOSIS — M25461 Effusion, right knee: Secondary | ICD-10-CM | POA: Diagnosis not present

## 2017-10-23 DIAGNOSIS — M0579 Rheumatoid arthritis with rheumatoid factor of multiple sites without organ or systems involvement: Secondary | ICD-10-CM | POA: Diagnosis not present

## 2017-10-23 DIAGNOSIS — Z79899 Other long term (current) drug therapy: Secondary | ICD-10-CM | POA: Diagnosis not present

## 2017-11-05 DIAGNOSIS — M7989 Other specified soft tissue disorders: Secondary | ICD-10-CM | POA: Diagnosis not present

## 2017-11-05 DIAGNOSIS — M79643 Pain in unspecified hand: Secondary | ICD-10-CM | POA: Diagnosis not present

## 2017-11-05 DIAGNOSIS — M25461 Effusion, right knee: Secondary | ICD-10-CM | POA: Diagnosis not present

## 2017-11-05 DIAGNOSIS — M25569 Pain in unspecified knee: Secondary | ICD-10-CM | POA: Diagnosis not present

## 2017-11-06 DIAGNOSIS — F331 Major depressive disorder, recurrent, moderate: Secondary | ICD-10-CM | POA: Diagnosis not present

## 2017-11-16 ENCOUNTER — Other Ambulatory Visit: Payer: Self-pay | Admitting: Allergy and Immunology

## 2017-11-27 DIAGNOSIS — Z131 Encounter for screening for diabetes mellitus: Secondary | ICD-10-CM | POA: Diagnosis not present

## 2017-11-27 DIAGNOSIS — Z0000 Encounter for general adult medical examination without abnormal findings: Secondary | ICD-10-CM | POA: Diagnosis not present

## 2017-11-27 DIAGNOSIS — Z79899 Other long term (current) drug therapy: Secondary | ICD-10-CM | POA: Diagnosis not present

## 2017-12-04 DIAGNOSIS — M791 Myalgia, unspecified site: Secondary | ICD-10-CM | POA: Diagnosis not present

## 2017-12-04 DIAGNOSIS — Z0000 Encounter for general adult medical examination without abnormal findings: Secondary | ICD-10-CM | POA: Diagnosis not present

## 2017-12-04 DIAGNOSIS — M0579 Rheumatoid arthritis with rheumatoid factor of multiple sites without organ or systems involvement: Secondary | ICD-10-CM | POA: Diagnosis not present

## 2017-12-04 DIAGNOSIS — G43909 Migraine, unspecified, not intractable, without status migrainosus: Secondary | ICD-10-CM | POA: Diagnosis not present

## 2017-12-05 DIAGNOSIS — M7989 Other specified soft tissue disorders: Secondary | ICD-10-CM | POA: Diagnosis not present

## 2017-12-05 DIAGNOSIS — M25569 Pain in unspecified knee: Secondary | ICD-10-CM | POA: Diagnosis not present

## 2017-12-05 DIAGNOSIS — M79643 Pain in unspecified hand: Secondary | ICD-10-CM | POA: Diagnosis not present

## 2017-12-05 DIAGNOSIS — M25461 Effusion, right knee: Secondary | ICD-10-CM | POA: Diagnosis not present

## 2017-12-12 DIAGNOSIS — F331 Major depressive disorder, recurrent, moderate: Secondary | ICD-10-CM | POA: Diagnosis not present

## 2017-12-20 DIAGNOSIS — H401131 Primary open-angle glaucoma, bilateral, mild stage: Secondary | ICD-10-CM | POA: Diagnosis not present

## 2018-01-01 ENCOUNTER — Ambulatory Visit: Payer: BLUE CROSS/BLUE SHIELD | Admitting: Allergy and Immunology

## 2018-01-08 ENCOUNTER — Encounter (HOSPITAL_COMMUNITY): Payer: Self-pay

## 2018-01-08 DIAGNOSIS — M25461 Effusion, right knee: Secondary | ICD-10-CM | POA: Diagnosis not present

## 2018-01-08 DIAGNOSIS — M79643 Pain in unspecified hand: Secondary | ICD-10-CM | POA: Diagnosis not present

## 2018-01-08 DIAGNOSIS — M7989 Other specified soft tissue disorders: Secondary | ICD-10-CM | POA: Diagnosis not present

## 2018-01-08 DIAGNOSIS — M25569 Pain in unspecified knee: Secondary | ICD-10-CM | POA: Diagnosis not present

## 2018-01-09 ENCOUNTER — Encounter: Payer: Self-pay | Admitting: Allergy

## 2018-01-09 ENCOUNTER — Ambulatory Visit (INDEPENDENT_AMBULATORY_CARE_PROVIDER_SITE_OTHER): Payer: BLUE CROSS/BLUE SHIELD | Admitting: Allergy

## 2018-01-09 VITALS — BP 122/72 | HR 68 | Resp 16

## 2018-01-09 DIAGNOSIS — J3089 Other allergic rhinitis: Secondary | ICD-10-CM

## 2018-01-09 DIAGNOSIS — G43909 Migraine, unspecified, not intractable, without status migrainosus: Secondary | ICD-10-CM | POA: Diagnosis not present

## 2018-01-09 DIAGNOSIS — J4540 Moderate persistent asthma, uncomplicated: Secondary | ICD-10-CM

## 2018-01-09 MED ORDER — MONTELUKAST SODIUM 10 MG PO TABS: 10.0000 mg | ORAL_TABLET | Freq: Every day | ORAL | 5 refills | Status: DC

## 2018-01-09 NOTE — Patient Instructions (Signed)
  1. Continue Periactin 6 mg bedtime (1-1/2 tablets)  2. Continue Symbicort 160 - 2 inhalations twice a day  3.  Start Singulair 10mg  daily (take at bedtime)   4. Continue Nasonex 1-2 spray each nostril once a day  5. Continue ProAir HFA and antihistamine if needed  6.  Will obtain labs as follows: CBC w diff, total IgE and environmental allergen profile.   Labs will help determine if you may benefit from biologic injectable medications for improved asthma control  7. Return to clinic in 3-4 months or earlier if problem

## 2018-01-09 NOTE — Progress Notes (Signed)
Follow-up Note  RE: Susan Castillo MRN: [id number] DOB: 03/17/1961 Date of Office Visit: 01/09/2018   History of present illness: Susan Castillo is a 57 y.o. female presenting today for follow-up of asthma, allergic rhinitis, migraine syndrome and dermatitis.  She was last seen in the office on July 08, 2017 by Dr. Neldon Mc.  Since this visit she has recently moved into a new house about 6 weeks ago.  She states that her yard is full of mud and her dogs track in the mud every time they go outside.  She feels that this has been causing her to have increased asthma symptoms.  She is having wheezing, chest tightness, difficulty breathing and cough.  Symptoms are worse at night.  She has had a couple of nights of nighttime awakenings from the past 2-3 weeks.  She has been needing to use her albuterol about 1-2 times a day.  Before moving into this home she states she will use her albuterol on average about 1-2 times a month.  She is taking her Symbicort 2 puffs twice a day.  She has not presented at urgent care or ED and has not had any oral steroids for her breathing symptoms.  However she does have rheumatoid arthritis and had been on 10 mg of prednisone for 4 months however was recently tapered off this week by her rheumatologist.  She does not does not feel her breathing is any worse being off of prednisone for the past week.  She was tried on methotrexate for her RA which did not help thus she will be going next week for an infusion of a new medication that she does not recall the name of at this time. She has been taking Nasonex 1 spray each nostril once a day which helps control nasal congestion and drainage.  She continues on Periactin at bedtime for control of migraines which does help.  She has not required any of her mometasone for the rash as she had been having previously.  Review of systems: Review of Systems  Constitutional: Negative for chills, fever and malaise/fatigue.  HENT:  Negative for congestion, ear discharge, ear pain, nosebleeds, sinus pain, sore throat and tinnitus.   Eyes: Negative for pain, discharge and redness.  Respiratory: Positive for cough, shortness of breath and wheezing. Negative for sputum production.   Cardiovascular: Positive for chest pain (Chest tightness).  Gastrointestinal: Negative for abdominal pain, constipation, diarrhea, heartburn, nausea and vomiting.  Musculoskeletal: Positive for joint pain. Negative for myalgias.  Skin: Negative for itching and rash.  Neurological: Negative for headaches.    All other systems negative unless noted above in HPI  Past medical/social/surgical/family history have been reviewed and are unchanged unless specifically indicated below.  See HPI  Medication List: Allergies as of 01/09/2018      Reactions   Codeine Nausea And Vomiting      Medication List        Accurate as of 01/09/18  6:11 PM. Always use your most recent med list.          albuterol 108 (90 Base) MCG/ACT inhaler Commonly known as:  PROAIR HFA INHALE 2 PUFFS BY MOUTH EVERY 4-6 HOURS AS NEEDED FOR COUGH OR WHEEZE   ARIPiprazole 5 MG tablet Commonly known as:  ABILIFY Take 5 mg by mouth daily.   budesonide-formoterol 160-4.5 MCG/ACT inhaler Commonly known as:  SYMBICORT Inhale two puffs twice daily to prevent cough or wheeze. Rinse, gargle, and spit after use.  CALCIUM + D PO Take by mouth.   cyproheptadine 4 MG tablet Commonly known as:  PERIACTIN TAKE 1 1/2 TABLET BY MOUTH EVERY DAY AS DIRECTED   DULoxetine 60 MG capsule Commonly known as:  CYMBALTA Take 1 capsule by mouth every morning   DULoxetine 30 MG capsule Commonly known as:  CYMBALTA Take 30 mg by mouth at bedtime.   latanoprost 0.005 % ophthalmic solution Commonly known as:  XALATAN INT 1 GTT IN OU Q NIGHT UTD   mometasone 50 MCG/ACT nasal spray Commonly known as:  NASONEX Use one spray in each nostril once daily as directed   MULTIVITAMIN  ADULT PO Take by mouth.   rosuvastatin 20 MG tablet Commonly known as:  CRESTOR       Known medication allergies: Allergies  Allergen Reactions  . Codeine Nausea And Vomiting     Physical examination: Blood pressure 122/72, pulse 68, resp. rate 16.  General: Alert, interactive, in no acute distress. HEENT: PERRLA, TMs pearly gray, turbinates minimally edematous without discharge, post-pharynx non erythematous. Neck: Supple without lymphadenopathy. Lungs: Clear to auscultation without wheezing, rhonchi or rales. {no increased work of breathing. CV: Normal S1, S2 without murmurs. Abdomen: Nondistended, nontender. Skin: Warm and dry, without lesions or rashes. Extremities:  No clubbing, cyanosis or edema. Neuro:   Grossly intact.  Diagnositics/Labs:  Spirometry: FEV1: 2.69L  100%, FVC: 3.02L  88%, ratio consistent with Nonobstructive pattern  Assessment and plan:   Moderate persistent asthma -she has had worsening symptoms since moving into her  new home and thus is likely having an allergic trigger for her asthma.  We will add  on Singulair to her regimen as well as obtain labs to determine if she may qualify  for either anti-IgE or anti-IL5 therapies if we need to provide further asthma control  as her environment is likely not going to change anytime soon. Allergic rhinitis  -continue Nasonex and will add on Singulair Migraine syndrome -under control with Periactin which she will continue  1. Continue Periactin 6 mg bedtime (1-1/2 tablets)  2. Continue Symbicort 160 - 2 inhalations twice a day  3.  Start Singulair 10mg  daily (take at bedtime)   4. Continue Nasonex 1-2 spray each nostril once a day  5. Continue ProAir HFA and antihistamine if needed  6.  Will obtain labs as follows: CBC w diff, total IgE and environmental allergen profile.   Labs will help determine if you may benefit from biologic injectable medications for improved asthma control  7. Return to  clinic in 3-4 months or earlier if problem   I appreciate the opportunity to take part in Susan Castillo's care. Please do not hesitate to contact me with questions.  Sincerely,   Prudy Feeler, MD Allergy/Immunology [facility] [address] [facility] [place]

## 2018-01-16 DIAGNOSIS — M0579 Rheumatoid arthritis with rheumatoid factor of multiple sites without organ or systems involvement: Secondary | ICD-10-CM | POA: Diagnosis not present

## 2018-01-21 LAB — ALLERGENS W/TOTAL IGE AREA 2
Alternaria Alternata IgE: <0.1 kU/L
Aspergillus Fumigatus IgE: <0.1 kU/L
Bermuda Grass IgE: <0.1 kU/L
Cat Dander IgE: 9.87 kU/L — AB
Cedar, Mountain IgE: 3.95 kU/L — AB
Cladosporium Herbarum IgE: <0.1 kU/L
Cockroach, German IgE: <0.1 kU/L
Common Silver Birch IgE: <0.1 kU/L
Cottonwood IgE: <0.1 kU/L
D Farinae IgE: 1.41 kU/L — AB
D Pteronyssinus IgE: 1.87 kU/L — AB
Dog Dander IgE: 2.26 kU/L — AB
Elm, American IgE: <0.1 kU/L
IgE (Immunoglobulin E), Serum: 380 IU/mL — ABNORMAL HIGH (ref 0–100)
Johnson Grass IgE: <0.1 kU/L
Maple/Box Elder IgE: <0.1 kU/L
Mouse Urine IgE: <0.1 kU/L
Oak, White IgE: <0.1 kU/L
Pecan, Hickory IgE: <0.1 kU/L
Penicillium Chrysogen IgE: <0.1 kU/L
Pigweed, Rough IgE: <0.1 kU/L
Ragweed, Short IgE: <0.1 kU/L
Sheep Sorrel IgE Qn: <0.1 kU/L
Timothy Grass IgE: <0.1 kU/L
White Mulberry IgE: <0.1 kU/L

## 2018-01-21 LAB — CBC WITH DIFFERENTIAL/PLATELET
Basophils Absolute: 0 10*3/uL (ref 0.0–0.2)
Basos: 0 %
EOS (ABSOLUTE): 0.1 10*3/uL (ref 0.0–0.4)
Eos: 1 %
Hematocrit: 42 % (ref 34.0–46.6)
Hemoglobin: 13.8 g/dL (ref 11.1–15.9)
Immature Grans (Abs): 0.1 10*3/uL (ref 0.0–0.1)
Immature Granulocytes: 1 %
Lymphocytes Absolute: 2 10*3/uL (ref 0.7–3.1)
Lymphs: 22 %
MCH: 31.5 pg (ref 26.6–33.0)
MCHC: 32.9 g/dL (ref 31.5–35.7)
MCV: 96 fL (ref 79–97)
Monocytes Absolute: 0.4 10*3/uL (ref 0.1–0.9)
Monocytes: 5 %
Neutrophils Absolute: 6.4 10*3/uL (ref 1.4–7.0)
Neutrophils: 71 %
Platelets: 355 10*3/uL (ref 150–379)
RBC: 4.38 x10E6/uL (ref 3.77–5.28)
RDW: 14 % (ref 12.3–15.4)
WBC: 8.9 10*3/uL (ref 3.4–10.8)

## 2018-01-24 DIAGNOSIS — H401131 Primary open-angle glaucoma, bilateral, mild stage: Secondary | ICD-10-CM | POA: Diagnosis not present

## 2018-02-05 DIAGNOSIS — E782 Mixed hyperlipidemia: Secondary | ICD-10-CM | POA: Diagnosis not present

## 2018-02-06 ENCOUNTER — Other Ambulatory Visit: Payer: Self-pay | Admitting: Allergy and Immunology

## 2018-02-12 DIAGNOSIS — R5383 Other fatigue: Secondary | ICD-10-CM | POA: Diagnosis not present

## 2018-02-12 DIAGNOSIS — I951 Orthostatic hypotension: Secondary | ICD-10-CM | POA: Diagnosis not present

## 2018-02-12 DIAGNOSIS — E782 Mixed hyperlipidemia: Secondary | ICD-10-CM | POA: Diagnosis not present

## 2018-02-12 DIAGNOSIS — R42 Dizziness and giddiness: Secondary | ICD-10-CM | POA: Diagnosis not present

## 2018-02-13 DIAGNOSIS — M0579 Rheumatoid arthritis with rheumatoid factor of multiple sites without organ or systems involvement: Secondary | ICD-10-CM | POA: Diagnosis not present

## 2018-02-20 DIAGNOSIS — Z0389 Encounter for observation for other suspected diseases and conditions ruled out: Secondary | ICD-10-CM | POA: Diagnosis not present

## 2018-02-20 DIAGNOSIS — R42 Dizziness and giddiness: Secondary | ICD-10-CM | POA: Diagnosis not present

## 2018-02-20 DIAGNOSIS — I951 Orthostatic hypotension: Secondary | ICD-10-CM | POA: Diagnosis not present

## 2018-03-04 DIAGNOSIS — F331 Major depressive disorder, recurrent, moderate: Secondary | ICD-10-CM | POA: Diagnosis not present

## 2018-03-05 DIAGNOSIS — E782 Mixed hyperlipidemia: Secondary | ICD-10-CM | POA: Diagnosis not present

## 2018-03-05 DIAGNOSIS — R42 Dizziness and giddiness: Secondary | ICD-10-CM | POA: Diagnosis not present

## 2018-03-10 DIAGNOSIS — Z1231 Encounter for screening mammogram for malignant neoplasm of breast: Secondary | ICD-10-CM | POA: Diagnosis not present

## 2018-03-18 DIAGNOSIS — M25569 Pain in unspecified knee: Secondary | ICD-10-CM | POA: Diagnosis not present

## 2018-03-18 DIAGNOSIS — M79643 Pain in unspecified hand: Secondary | ICD-10-CM | POA: Diagnosis not present

## 2018-03-18 DIAGNOSIS — M25461 Effusion, right knee: Secondary | ICD-10-CM | POA: Diagnosis not present

## 2018-03-18 DIAGNOSIS — M7989 Other specified soft tissue disorders: Secondary | ICD-10-CM | POA: Diagnosis not present

## 2018-04-10 DIAGNOSIS — M0579 Rheumatoid arthritis with rheumatoid factor of multiple sites without organ or systems involvement: Secondary | ICD-10-CM | POA: Diagnosis not present

## 2018-04-29 ENCOUNTER — Ambulatory Visit: Payer: BLUE CROSS/BLUE SHIELD | Admitting: Allergy

## 2018-04-29 DIAGNOSIS — H401131 Primary open-angle glaucoma, bilateral, mild stage: Secondary | ICD-10-CM | POA: Diagnosis not present

## 2018-05-19 DIAGNOSIS — M8580 Other specified disorders of bone density and structure, unspecified site: Secondary | ICD-10-CM | POA: Diagnosis not present

## 2018-05-19 DIAGNOSIS — R748 Abnormal levels of other serum enzymes: Secondary | ICD-10-CM | POA: Diagnosis not present

## 2018-05-19 DIAGNOSIS — M25569 Pain in unspecified knee: Secondary | ICD-10-CM | POA: Diagnosis not present

## 2018-05-19 DIAGNOSIS — M79643 Pain in unspecified hand: Secondary | ICD-10-CM | POA: Diagnosis not present

## 2018-05-19 DIAGNOSIS — Z79899 Other long term (current) drug therapy: Secondary | ICD-10-CM | POA: Diagnosis not present

## 2018-05-20 ENCOUNTER — Ambulatory Visit: Payer: BLUE CROSS/BLUE SHIELD | Admitting: Allergy

## 2018-05-20 ENCOUNTER — Other Ambulatory Visit: Payer: Self-pay

## 2018-05-20 ENCOUNTER — Encounter: Payer: Self-pay | Admitting: Allergy

## 2018-05-20 VITALS — BP 122/72 | HR 92 | Resp 16

## 2018-05-20 DIAGNOSIS — J4540 Moderate persistent asthma, uncomplicated: Secondary | ICD-10-CM

## 2018-05-20 DIAGNOSIS — G43909 Migraine, unspecified, not intractable, without status migrainosus: Secondary | ICD-10-CM

## 2018-05-20 DIAGNOSIS — J3089 Other allergic rhinitis: Secondary | ICD-10-CM | POA: Diagnosis not present

## 2018-05-20 MED ORDER — BUDESONIDE-FORMOTEROL FUMARATE 160-4.5 MCG/ACT IN AERO: INHALATION_SPRAY | RESPIRATORY_TRACT | 1 refills | Status: DC

## 2018-05-20 MED ORDER — CYPROHEPTADINE HCL 4 MG PO TABS: ORAL_TABLET | ORAL | 1 refills | Status: DC

## 2018-05-20 MED ORDER — MOMETASONE FUROATE 50 MCG/ACT NA SUSP: NASAL | 1 refills | Status: DC

## 2018-05-20 NOTE — Progress Notes (Signed)
Follow-up Note  RE: Susan Castillo MRN: [id number] DOB: June 15, 1961 Date of Office Visit: 05/20/2018   History of present illness: Susan Castillo is a 57 y.o. female presenting today for follow-up of asthma, allergic rhinitis and migraines. She was last seen in the office on 01/09/18 by myself for routine follow-up visit.  Since this visit she denies any major health changes, surgeries or hospitalizations.  She states she has been doing better with addition of singulair to her asthma regimen.  She is on symbicort 2 puffs twice a day.  She states she needs to use albuterol less than once a week and usually only needs to use if she has exercised at [facility].  She denies any nighttime awakenings.  No ED/UC visits or oral steroids needs since last visit.  With her allergies she does feel like she is more symptomatic with pollen season however she states the bit of runny nose she has in the morning is not enough to warrant any additional medications.  She does use nasonex daily which she finds helpful from nasal congestion standpoint.   She continues on periactin for migraine control and states she has has no minor headaches but no full blown migraines.    Review of systems: Review of Systems  Constitutional: Negative for chills, fever and malaise/fatigue.  HENT: Positive for congestion and sore throat. Negative for ear discharge, ear pain, nosebleeds and sinus pain.   Eyes: Negative for pain, discharge and redness.  Respiratory: Positive for cough, shortness of breath and wheezing.   Cardiovascular: Negative for chest pain.  Gastrointestinal: Negative for abdominal pain, constipation, diarrhea, heartburn, nausea and vomiting.  Musculoskeletal: Negative for joint pain.  Skin: Negative for itching and rash.  Neurological: Negative for headaches.    All other systems negative unless noted above in HPI  Past medical/social/surgical/family history have been reviewed and are unchanged unless  specifically indicated below.  No changes  Medication List: Allergies as of 05/20/2018      Reactions   Codeine Nausea And Vomiting      Medication List        Accurate as of 05/20/18  4:17 PM. Always use your most recent med list.          albuterol 108 (90 Base) MCG/ACT inhaler Commonly known as:  PROAIR HFA INHALE 2 PUFFS BY MOUTH EVERY 4-6 HOURS AS NEEDED FOR COUGH OR WHEEZE   ARIPiprazole 5 MG tablet Commonly known as:  ABILIFY Take 5 mg by mouth daily.   budesonide-formoterol 160-4.5 MCG/ACT inhaler Commonly known as:  SYMBICORT Inhale two puffs twice daily to prevent cough or wheeze. Rinse, gargle, and spit after use.   CALCIUM + D PO Take by mouth.   cyproheptadine 4 MG tablet Commonly known as:  PERIACTIN TAKE 1 1/2 TABLET BY MOUTH EVERY DAY AS DIRECTED   DULoxetine 60 MG capsule Commonly known as:  CYMBALTA Take 1 capsule by mouth every morning   DULoxetine 30 MG capsule Commonly known as:  CYMBALTA Take 30 mg by mouth at bedtime.   folic acid 1 MG tablet Commonly known as:  FOLVITE TK 1 T PO QD   ketoconazole 2 % shampoo Commonly known as:  NIZORAL USE 2 TO 3 TIMES PER WEEK UTD   latanoprost 0.005 % ophthalmic solution Commonly known as:  XALATAN INT 1 GTT IN OU Q NIGHT UTD   methotrexate 2.5 MG tablet Commonly known as:  RHEUMATREX TK 4 TS PO 1 TIME A WK  mometasone 50 MCG/ACT nasal spray Commonly known as:  NASONEX Use one spray in each nostril once daily as directed   montelukast 10 MG tablet Commonly known as:  SINGULAIR Take 1 tablet (10 mg total) by mouth at bedtime.   MULTIVITAMIN ADULT PO Take by mouth.   rosuvastatin 20 MG tablet Commonly known as:  CRESTOR   traZODone 100 MG tablet Commonly known as:  DESYREL TAKE 1/2 TO 1 T PO QHS  PRF INSOMNIA       Known medication allergies: Allergies  Allergen Reactions  . Codeine Nausea And Vomiting     Physical examination: Blood pressure 122/72, pulse 92, resp. rate  16.  General: Alert, interactive, in no acute distress. HEENT: PERRLA, TMs pearly gray, turbinates mildly edematous with clear discharge, post-pharynx non erythematous. Neck: Supple without lymphadenopathy. Lungs: Clear to auscultation without wheezing, rhonchi or rales. {no increased work of breathing. CV: Normal S1, S2 without murmurs. Abdomen: Nondistended, nontender. Skin: Warm and dry, without lesions or rashes. Extremities:  No clubbing, cyanosis or edema. Neuro:   Grossly intact.  Diagnositics/Labs: Labs: Component     Latest Ref Rng & Units 01/17/2018  IgE (Immunoglobulin E), Serum     0 - 100 IU/mL 380 (H)  D Pteronyssinus IgE     Class III kU/L 1.87 (A)  D Farinae IgE     Class III kU/L 1.41 (A)  Cat Dander IgE     Class IV kU/L 9.87 (A)  Dog Dander IgE     Class III kU/L 2.26 (A)  [place] Grass IgE     Class 0 kU/L <0.10  Timothy Grass IgE     Class 0 kU/L <0.10  Johnson Grass IgE     Class 0 kU/L <0.10  Cockroach, German IgE     Class 0 kU/L <0.10  Penicillium Chrysogen IgE     Class 0 kU/L <0.10  Cladosporium Herbarum IgE     Class 0 kU/L <0.10  Aspergillus Fumigatus IgE     Class 0 kU/L <0.10  Alternaria Alternata IgE     Class 0 kU/L <0.10  Maple/Box Elder IgE     Class 0 kU/L <0.10  Common Silver Wendee Copp IgE     Class 0 kU/L <0.10  Cedar, [place] IgE     Class IV kU/L 3.95 (A)  Oak, White IgE     Class 0 kU/L <0.10  Elm, American IgE     Class 0 kU/L <0.10  Cottonwood IgE     Class 0 kU/L <0.10  Pecan, Hickory IgE     Class 0 kU/L <0.10  White Mulberry IgE     Class 0 kU/L <0.10  Ragweed, Short IgE     Class 0 kU/L <0.10  Pigweed, Rough IgE     Class 0 kU/L <0.10  Sheep Sorrel IgE Qn     Class 0 kU/L <0.10  Mouse Urine IgE     Class 0 kU/L <0.10  WBC     3.4 - 10.8 x10E3/uL 8.9  RBC     3.77 - 5.28 x10E6/uL 4.38  Hemoglobin     11.1 - 15.9 g/dL 13.8  HCT     34.0 - 46.6 % 42.0  MCV     79 - 97 fL 96  MCH     26.6 - 33.0 pg  31.5  MCHC     31.5 - 35.7 g/dL 32.9  RDW     12.3 - 15.4 % 14.0  Platelets  150 - 379 x10E3/uL 355  Neutrophils     Not Estab. % 71  Lymphs     Not Estab. % 22  Monocytes     Not Estab. % 5  Eos     Not Estab. % 1  Basos     Not Estab. % 0  NEUT#     1.4 - 7.0 x10E3/uL 6.4  Lymphocyte #     0.7 - 3.1 x10E3/uL 2.0  Monocytes Absolute     0.1 - 0.9 x10E3/uL 0.4  EOS (ABSOLUTE)     0.0 - 0.4 x10E3/uL 0.1  Basophils Absolute     0.0 - 0.2 x10E3/uL 0.0  Immature Granulocytes     Not Estab. % 1  Immature Grans (Abs)     0.0 - 0.1 x10E3/uL 0.1    Spirometry: FEV1: 2.51L  94%, FVC: 2.77L  81%, ratio consistent with nonobstructive pattern  Assessment and plan:   Moderate persistent asthma -she has improvement in control since addition of singulair.  She developed worsening asthma symptoms when she moved into her new home.  At this time control is good.  She would qualify for Xolair if we needed to step up therapy.   Allergic rhinitis  - discussed potential of adding in a nasal antihistamine (Astelin) to nasal spray regimen to help decreased PND.  She does not feel the PND is that bad at this time to warrant additional nasal spray use. She will let us know if she would like to add this on to allergy regimen Migraine syndrome -under control with Periactin which she will continue  1. Continue Periactin 6 mg bedtime (1-1/2 tablets)  2. Continue Symbicort 160 - 2 inhalations twice a day  3.  Continue Singulair 10mg  daily (take at bedtime)   4. Continue Nasonex 1-2 spray each nostril once a day  5. Continue ProAir HFA and antihistamine if needed  Return to clinic in 6 months or earlier if problem  I appreciate the opportunity to take part in Eura's care. Please do not hesitate to contact me with questions.  Sincerely,   Prudy Feeler, MD Allergy/Immunology [facility] [place]

## 2018-05-20 NOTE — Patient Instructions (Addendum)
1. Continue Periactin 6 mg bedtime (1-1/2 tablets)  2. Continue Symbicort 160 - 2 inhalations twice a day  3.  Continue Singulair 10mg daily (take at bedtime)   4. Continue Nasonex 1-2 spray each nostril once a day  5. Continue ProAir HFA and antihistamine if needed  Return to clinic in 6 months or earlier if problem   

## 2018-05-27 DIAGNOSIS — F331 Major depressive disorder, recurrent, moderate: Secondary | ICD-10-CM | POA: Diagnosis not present

## 2018-06-05 DIAGNOSIS — M0579 Rheumatoid arthritis with rheumatoid factor of multiple sites without organ or systems involvement: Secondary | ICD-10-CM | POA: Diagnosis not present

## 2018-07-10 ENCOUNTER — Other Ambulatory Visit: Payer: Self-pay

## 2018-07-10 DIAGNOSIS — J4540 Moderate persistent asthma, uncomplicated: Secondary | ICD-10-CM

## 2018-07-10 MED ORDER — MONTELUKAST SODIUM 10 MG PO TABS: 10.0000 mg | ORAL_TABLET | Freq: Every day | ORAL | 5 refills | Status: DC

## 2018-07-31 DIAGNOSIS — M0579 Rheumatoid arthritis with rheumatoid factor of multiple sites without organ or systems involvement: Secondary | ICD-10-CM | POA: Diagnosis not present

## 2018-08-25 DIAGNOSIS — Z23 Encounter for immunization: Secondary | ICD-10-CM | POA: Diagnosis not present

## 2018-08-25 DIAGNOSIS — M1990 Unspecified osteoarthritis, unspecified site: Secondary | ICD-10-CM | POA: Diagnosis not present

## 2018-08-25 DIAGNOSIS — Z79899 Other long term (current) drug therapy: Secondary | ICD-10-CM | POA: Diagnosis not present

## 2018-08-25 DIAGNOSIS — R748 Abnormal levels of other serum enzymes: Secondary | ICD-10-CM | POA: Diagnosis not present

## 2018-08-25 DIAGNOSIS — M0579 Rheumatoid arthritis with rheumatoid factor of multiple sites without organ or systems involvement: Secondary | ICD-10-CM | POA: Diagnosis not present

## 2018-08-27 ENCOUNTER — Other Ambulatory Visit: Payer: Self-pay | Admitting: *Deleted

## 2018-08-27 ENCOUNTER — Telehealth: Payer: Self-pay | Admitting: Allergy

## 2018-08-27 MED ORDER — CYPROHEPTADINE HCL 4 MG PO TABS: ORAL_TABLET | ORAL | 0 refills | Status: DC

## 2018-08-27 NOTE — Telephone Encounter (Signed)
[place] would like a 90 day refill of PERIACTIN sent in to [facility] on [address] please.

## 2018-08-27 NOTE — Telephone Encounter (Signed)
RX sent

## 2018-09-03 IMAGING — CT CT L SPINE W/ CM
1 of 6 series · 5 of 14 positions shown, 7 images · non-contrast
Comparison: Lumbar spine MRI 09/11/2012

CLINICAL DATA: Low back pain and left lower extremity pain.
TECHNIQUE: Contiguous axial images were obtained through the Lumbar spine after
the intrathecal infusion of infusion. Coronal and sagittal
reconstructions were obtained of the axial image sets.

[Series 2: l spine soft (person_name) · axial · 0.27mm/px · z∈[-293,-143]mm · 5 of 76 slices shown, 7 images]
[im 13/76  soft-tissue]
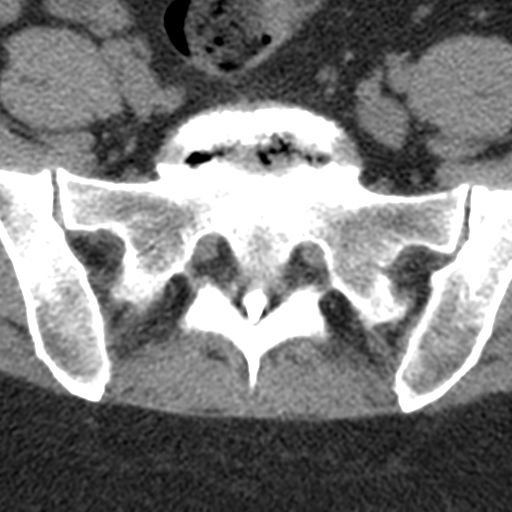
[im 13/76  bone]
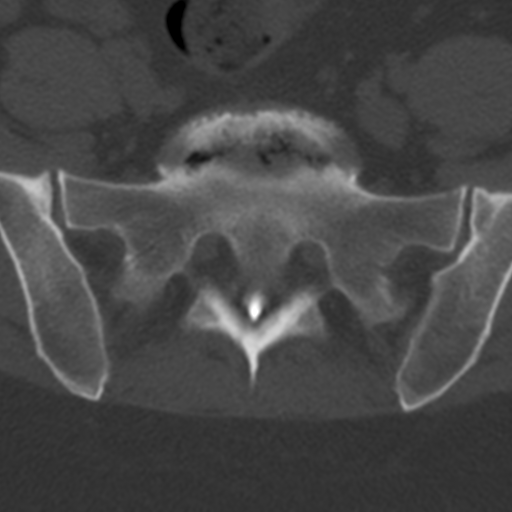
[im 26/76  bone]
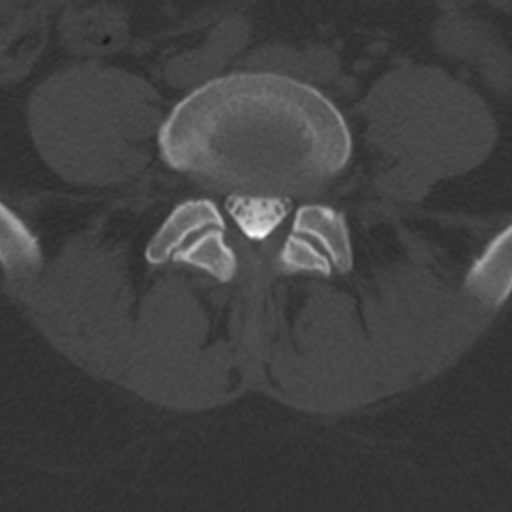
[im 38/76  bone]
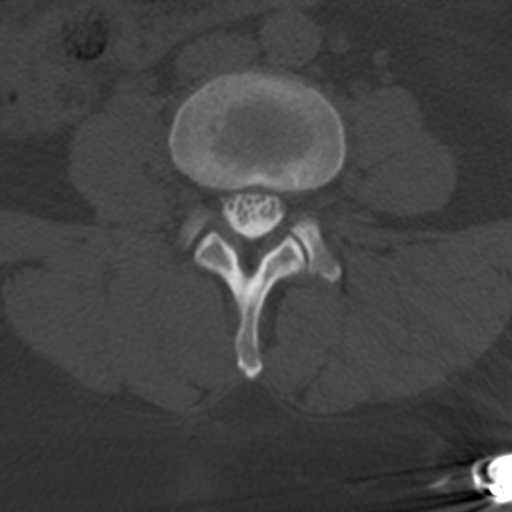
[im 51/76  bone]
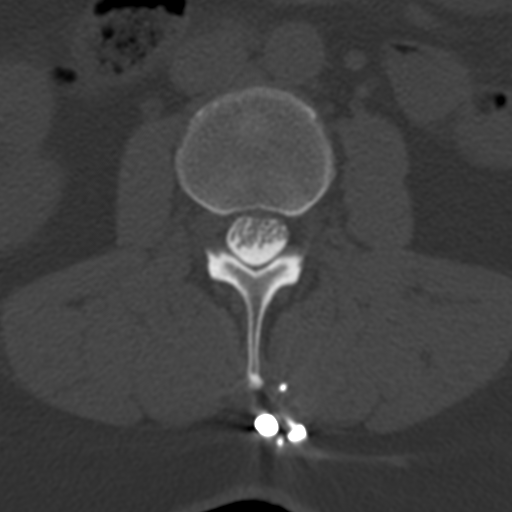
[im 63/76  soft-tissue]
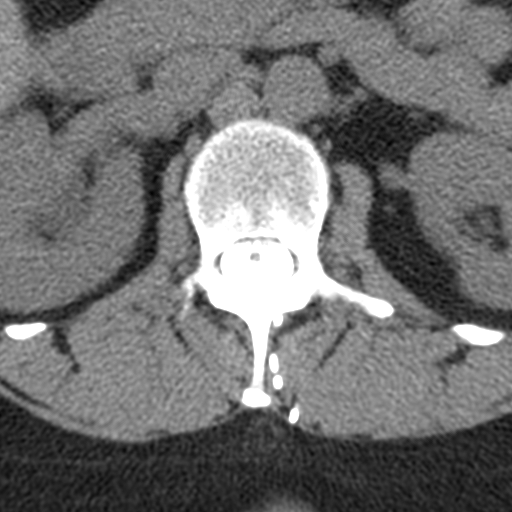
[im 63/76  bone]
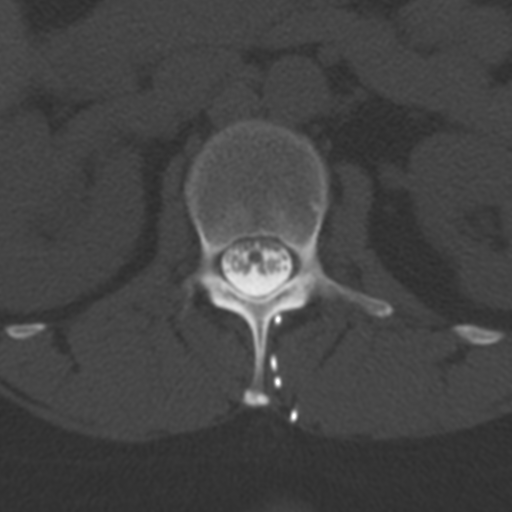

[5 of 14 positions shown; findings below may reference images not displayed]

EXAM:
LUMBAR MYELOGRAM

FLUOROSCOPY TIME:  Radiation Exposure Index (as provided by the
fluoroscopic device): 251.09 microGray*m^2

Fluoroscopy Time (in minutes and seconds):  10 seconds

PROCEDURE:
After thorough discussion of risks and benefits of the procedure
including bleeding, infection, injury to nerves, blood vessels,
adjacent structures as well as headache and CSF leak, written and
oral informed consent was obtained. Consent was obtained by Dr.
Nouhail Salmon. Time out form was completed.

Patient was positioned prone on the fluoroscopy table. Local
anesthesia was provided with 1% lidocaine without epinephrine after
prepped and draped in the usual sterile fashion. Puncture was
performed at L3-4 using a 3 1/2 inch 22-gauge spinal needle via a
right interlaminar approach. Using a single pass through the dura,
the needle was placed within the thecal sac, with return of clear
CSF. 15 mL of Isovue M 200 was injected into the thecal sac, with
normal opacification of the nerve roots and cauda equina consistent
with free flow within the subarachnoid space.

I personally performed the lumbar puncture and administered the
intrathecal contrast. I also personally supervised acquisition of
the myelogram images.
FINDINGS: LUMBAR MYELOGRAM FINDINGS:

There are 5 non rib-bearing lumbar type vertebrae. Vertebral
alignment is normal. There is no evidence of dynamic instability on
upright flexion or extension images. There is a small ventral
extradural defect at L2-3 with mild underfilling of the right L3
nerve root sleeve. The ventral extradural defect does not
significantly change with standing, and there is no significant
spinal stenosis. The thecal sac is widely patent elsewhere without
nerve root sleeve cut off. A spinal stimulator is partially
visualized with leads extending at least to the T8 level.

CT LUMBAR MYELOGRAM FINDINGS:

Vertebral alignment is normal. An 8 mm sclerotic focus in the T12
vertebral body is unchanged from the prior MRI and likely benign. No
fracture or destructive osseous process is identified. Spinal
stimulator leads enter the dorsal canal at T12-L1 and course
superiorly into the thoracic spine, incompletely imaged. The conus
medullaris terminates at the lower L1 level. The paraspinal soft
tissues are unremarkable.

T12-L1 and L1-2:  Negative.

L2-3: A new small right subarticular disc extrusion demonstrates
mild superior migration and results in moderate right lateral recess
stenosis with posterior L3 nerve root deflection and mild medial
right neural foraminal stenosis. This could affect both the right L2
and right L3 nerve root. Mild disc bulging. No spinal stenosis or
left neural foraminal stenosis.

L3-4:  Minimal leftward disc bulging without stenosis.

L4-5: Minimal disc bulging and mild facet and ligamentum flavum
hypertrophy result in minimal left greater than right lateral recess
narrowing without evidence of L5 nerve root impingement. No spinal
or neural foraminal stenosis.

L5-S1: Disc degeneration with mild disc space narrowing, vacuum
disc, endplate sclerosis, and spurring. Circumferential disc bulging
has mildly progressed from the prior MRI and along with foraminal
endplate spurring and disc space height loss results in mild
bilateral neural foraminal stenosis without L5 nerve root
compression. No spinal stenosis.
IMPRESSION: 1. Mildly progressive disc degeneration at L5-S1 with mild bilateral
neural foraminal stenosis.
2. New right subarticular disc extrusion at L2-3 with right lateral
recess and right neural foraminal stenosis.
3. Mild posterior element hypertrophy and disc bulging at L4-5
without significant stenosis.

## 2018-09-25 DIAGNOSIS — M0579 Rheumatoid arthritis with rheumatoid factor of multiple sites without organ or systems involvement: Secondary | ICD-10-CM | POA: Diagnosis not present

## 2018-10-08 DIAGNOSIS — E782 Mixed hyperlipidemia: Secondary | ICD-10-CM | POA: Diagnosis not present

## 2018-10-15 DIAGNOSIS — M791 Myalgia, unspecified site: Secondary | ICD-10-CM | POA: Diagnosis not present

## 2018-10-15 DIAGNOSIS — M545 Low back pain: Secondary | ICD-10-CM | POA: Diagnosis not present

## 2018-10-15 DIAGNOSIS — Z23 Encounter for immunization: Secondary | ICD-10-CM | POA: Diagnosis not present

## 2018-10-15 DIAGNOSIS — E782 Mixed hyperlipidemia: Secondary | ICD-10-CM | POA: Diagnosis not present

## 2018-10-15 DIAGNOSIS — R42 Dizziness and giddiness: Secondary | ICD-10-CM | POA: Diagnosis not present

## 2018-11-03 DIAGNOSIS — H401131 Primary open-angle glaucoma, bilateral, mild stage: Secondary | ICD-10-CM | POA: Diagnosis not present

## 2018-11-11 ENCOUNTER — Encounter: Payer: Self-pay | Admitting: Allergy

## 2018-11-11 ENCOUNTER — Ambulatory Visit (INDEPENDENT_AMBULATORY_CARE_PROVIDER_SITE_OTHER): Payer: BLUE CROSS/BLUE SHIELD | Admitting: Allergy

## 2018-11-11 VITALS — BP 110/76 | HR 80 | Resp 16

## 2018-11-11 DIAGNOSIS — J3089 Other allergic rhinitis: Secondary | ICD-10-CM

## 2018-11-11 DIAGNOSIS — G43909 Migraine, unspecified, not intractable, without status migrainosus: Secondary | ICD-10-CM | POA: Diagnosis not present

## 2018-11-11 DIAGNOSIS — J4540 Moderate persistent asthma, uncomplicated: Secondary | ICD-10-CM | POA: Diagnosis not present

## 2018-11-11 MED ORDER — MOMETASONE FUROATE 50 MCG/ACT NA SUSP: NASAL | 1 refills | Status: DC

## 2018-11-11 MED ORDER — BUDESONIDE-FORMOTEROL FUMARATE 160-4.5 MCG/ACT IN AERO: INHALATION_SPRAY | RESPIRATORY_TRACT | 1 refills | Status: DC

## 2018-11-11 MED ORDER — ALBUTEROL SULFATE HFA 108 (90 BASE) MCG/ACT IN AERS: INHALATION_SPRAY | RESPIRATORY_TRACT | 1 refills | Status: DC

## 2018-11-11 MED ORDER — CYPROHEPTADINE HCL 4 MG PO TABS: ORAL_TABLET | ORAL | 1 refills | Status: DC

## 2018-11-11 MED ORDER — MONTELUKAST SODIUM 10 MG PO TABS: 10.0000 mg | ORAL_TABLET | Freq: Every day | ORAL | 1 refills | Status: DC

## 2018-11-11 NOTE — Patient Instructions (Signed)
1. Continue Periactin 6 mg bedtime (1-1/2 tablets)  2. Continue Symbicort 160 - 2 inhalations twice a day  3.  Continue Singulair 10mg  daily (take at bedtime)   4. Continue Nasonex 1-2 spray each nostril once a day  5. Continue ProAir HFA and antihistamine if needed  Return to clinic in 6 months or earlier if problem

## 2018-11-11 NOTE — Progress Notes (Signed)
Follow-up Note  RE: Susan Castillo MRN: [id number] DOB: May 08, 1961 Date of Office Visit: 11/11/2018   History of present illness: Susan Castillo is a 57 y.o. female presenting today for follow-up of asthma, allergic rhinitis and migraines.  She was last seen in the office on 05/20/18 by myself.  She has done well since her last visit without any major health changes, surgeries or hospitalizations.  She has had her flu vaccine this cycle.   She states her asthma has been well controlled without any flares since last visit.  She has not had any ED/UC visits or steroid needs.  She states she has only required albuterol 1-2 times with exercise.  She denies any nighttime awakenings.  She continues to take Symbicort 160 mcg 2 puffs twice a day.  She also is on singular daily. She states she has been having a little bit more nasal drainage in the mornings she says is not very bothersome to her and she dispose her nose.  She then follows with her Nasonex.  She is satisfied with the Nasonex and how her allergy symptoms have been controlled. With her migraines she is well controlled with Periactin daily.  Review of systems: Review of Systems  Constitutional: Negative for chills, fever and malaise/fatigue.  HENT: Negative for congestion, ear discharge, ear pain, nosebleeds and sore throat.   Eyes: Negative for pain, discharge and redness.  Respiratory: Negative for cough, shortness of breath and wheezing.   Cardiovascular: Negative for chest pain.  Gastrointestinal: Negative for abdominal pain, constipation, diarrhea, heartburn, nausea and vomiting.  Musculoskeletal: Negative for joint pain.  Skin: Negative for itching and rash.  Neurological: Negative for headaches.    All other systems negative unless noted above in HPI  Past medical/social/surgical/family history have been reviewed and are unchanged unless specifically indicated below.  No changes  Medication List: Allergies as of  11/11/2018      Reactions   Codeine Nausea And Vomiting      Medication List        Accurate as of 11/11/18  4:23 PM. Always use your most recent med list.          albuterol 108 (90 Base) MCG/ACT inhaler Commonly known as:  PROVENTIL HFA;VENTOLIN HFA INHALE 2 PUFFS BY MOUTH EVERY 4-6 HOURS AS NEEDED FOR COUGH OR WHEEZE   ARIPiprazole 5 MG tablet Commonly known as:  ABILIFY Take 5 mg by mouth daily.   budesonide-formoterol 160-4.5 MCG/ACT inhaler Commonly known as:  SYMBICORT Inhale two puffs twice daily to prevent cough or wheeze. Rinse, gargle, and spit after use.   CALCIUM + D PO Take by mouth.   cyproheptadine 4 MG tablet Commonly known as:  PERIACTIN Take 1 and 1/2 tablets once daily at bedtime   DULoxetine 60 MG capsule Commonly known as:  CYMBALTA Take 1 capsule by mouth every morning   DULoxetine 30 MG capsule Commonly known as:  CYMBALTA Take 30 mg by mouth at bedtime.   folic acid 1 MG tablet Commonly known as:  FOLVITE TK 1 T PO QD   ketoconazole 2 % shampoo Commonly known as:  NIZORAL USE 2 TO 3 TIMES PER WEEK UTD   latanoprost 0.005 % ophthalmic solution Commonly known as:  XALATAN INT 1 GTT IN OU Q NIGHT UTD   methotrexate 2.5 MG tablet Commonly known as:  RHEUMATREX TK 4 TS PO 1 TIME A WK   mometasone 50 MCG/ACT nasal spray Commonly known as:  NASONEX Use one  spray in each nostril once daily as directed   montelukast 10 MG tablet Commonly known as:  SINGULAIR Take 1 tablet (10 mg total) by mouth at bedtime.   MULTIVITAMIN ADULT PO Take by mouth.   rosuvastatin 20 MG tablet Commonly known as:  CRESTOR   traZODone 50 MG tablet Commonly known as:  DESYREL TK 1 T PO AT HOUR OF SLEEP       Known medication allergies: Allergies  Allergen Reactions  . Codeine Nausea And Vomiting     Physical examination: Blood pressure 110/76, pulse 80, resp. rate 16.  General: Alert, interactive, in no acute distress. HEENT: PERRLA, TMs  pearly gray, turbinates minimally edematous without discharge, post-pharynx non erythematous. Neck: Supple without lymphadenopathy. Lungs: Clear to auscultation without wheezing, rhonchi or rales. {no increased work of breathing. CV: Normal S1, S2 without murmurs. Abdomen: Nondistended, nontender. Skin: Warm and dry, without lesions or rashes. Extremities:  No clubbing, cyanosis or edema. Neuro:   Grossly intact.  Diagnositics/Labs:  Spirometry: FEV1: 2.78L 104%, FVC: 3.18L 93%, ratio consistent with nonobstructive pattern  Assessment and plan:   Moderate persistent asthma-she has continued improvement in control since addition of singulair.  She is under good control at this time. She would qualify for Xolair if we needed to step up therapy.   Allergic rhinitis- discussed potential of adding in a nasal antihistamine (Astelin) to nasal spray regimen to help decreased PND.  She does not feel the PND is that bad at this time to warrant additional nasal spray use. She will let us know if she would like to add this on to allergy regimen Migraine syndrome-under control with Periactin which she will continue  1. Continue Periactin 6 mg bedtime (1-1/2 tablets)  2. Continue Symbicort 160 - 2 inhalations twice a day  3.  Continue Singulair 10mg  daily (take at bedtime)   4. Continue Nasonex 1-2 spray each nostril once a day  5. Continue ProAir HFA and antihistamine if needed  Return to clinic in 6 months or earlier if problem  I appreciate the opportunity to take part in Shamel's care. Please do not hesitate to contact me with questions.  Sincerely,   Prudy Feeler, MD Allergy/Immunology [facility]

## 2018-11-13 ENCOUNTER — Ambulatory Visit (INDEPENDENT_AMBULATORY_CARE_PROVIDER_SITE_OTHER): Payer: BLUE CROSS/BLUE SHIELD | Admitting: Psychiatry

## 2018-11-13 ENCOUNTER — Encounter: Payer: Self-pay | Admitting: Psychiatry

## 2018-11-13 VITALS — BP 103/60 | HR 72

## 2018-11-13 DIAGNOSIS — F3342 Major depressive disorder, recurrent, in full remission: Secondary | ICD-10-CM | POA: Diagnosis not present

## 2018-11-13 DIAGNOSIS — F419 Anxiety disorder, unspecified: Secondary | ICD-10-CM

## 2018-11-13 DIAGNOSIS — F5101 Primary insomnia: Secondary | ICD-10-CM

## 2018-11-13 MED ORDER — DULOXETINE HCL 30 MG PO CPEP: 30.0000 mg | ORAL_CAPSULE | Freq: Every day | ORAL | 2 refills | Status: DC

## 2018-11-13 MED ORDER — TRAZODONE HCL 50 MG PO TABS: 50.0000 mg | ORAL_TABLET | Freq: Every day | ORAL | 2 refills | Status: DC

## 2018-11-13 MED ORDER — DULOXETINE HCL 60 MG PO CPEP: 60.0000 mg | ORAL_CAPSULE | Freq: Every day | ORAL | 2 refills | Status: DC

## 2018-11-13 MED ORDER — ARIPIPRAZOLE 5 MG PO TABS: 5.0000 mg | ORAL_TABLET | Freq: Every day | ORAL | 2 refills | Status: DC

## 2018-11-13 NOTE — Progress Notes (Signed)
Susan Castillo [id number] [birth year] 57 y.o.  Subjective:   Patient ID:  Susan Castillo is a 57 y.o. (DOB [birth year]) female.  Chief Complaint:  Chief Complaint  Patient presents with  . Follow-up    h/o mood, anxiety, and insomnia    HPI Susan Castillo presents to the office today for follow-up of mood and anxiety. She reports that mood remains stable. She reports that her anxiety has been well controlled- "my biggest stress is not having enough to do at work right now." She reports that her mood has been good. She reports that she has been cooking more and eating healthier. She reports that her appetite is good and she is trying to control overeating at night while reading. She reports that she has been sleeping "really well." She reports that her energy and motivation have been good. Concentration is adequate. Denies SI.   Son is now home from the [employer] and now looking for work. Son is now living with her and reports that this is working out well.   Past Medication Trials: Cymbalta-effective Concerta Abilify Trazodone Rexulti  Review of Systems:  Review of Systems  Gastrointestinal: Negative.   Musculoskeletal: Positive for arthralgias. Negative for gait problem.       She reports RA pain is increased the week before her infusion.  Neurological: Negative for tremors.  Psychiatric/Behavioral:       Please refer to HPI   She reports that cholesterol and other labs were recently obtained and results were WNL.   Medications: I have reviewed the patient's current medications.  Current Outpatient Medications  Medication Sig Dispense Refill  . albuterol (PROAIR HFA) 108 (90 Base) MCG/ACT inhaler INHALE 2 PUFFS BY MOUTH EVERY 4-6 HOURS AS NEEDED FOR COUGH OR WHEEZE 18 g 1  . ARIPiprazole (ABILIFY) 5 MG tablet Take 1 tablet (5 mg total) by mouth daily. 90 tablet 2  . budesonide-formoterol (SYMBICORT) 160-4.5 MCG/ACT inhaler Inhale two puffs twice daily to prevent cough or  wheeze. Rinse, gargle, and spit after use. 3 Inhaler 1  . Calcium Citrate-Vitamin D (CALCIUM + D PO) Take by mouth.    . cyproheptadine (PERIACTIN) 4 MG tablet Take 1 and 1/2 tablets once daily at bedtime 135 tablet 1  . DULoxetine (CYMBALTA) 30 MG capsule Take 1 capsule (30 mg total) by mouth at bedtime. 90 capsule 2  . DULoxetine (CYMBALTA) 60 MG capsule Take 1 capsule by mouth every morning  3  . folic acid (FOLVITE) 1 MG tablet TK 1 T PO QD  0  . golimumab 2 mg/kg in sodium chloride 0.9 % Inject 2 mg/kg into the vein every 8 (eight) weeks.    Marland Kitchen ketoconazole (NIZORAL) 2 % shampoo USE 2 TO 3 TIMES PER WEEK UTD  3  . latanoprost (XALATAN) 0.005 % ophthalmic solution INT 1 GTT IN OU Q NIGHT UTD  11  . methotrexate (RHEUMATREX) 2.5 MG tablet TK 4 TS PO 1 TIME A WK  1  . mometasone (NASONEX) 50 MCG/ACT nasal spray Use one spray in each nostril once daily as directed 51 g 1  . montelukast (SINGULAIR) 10 MG tablet Take 1 tablet (10 mg total) by mouth at bedtime. 90 tablet 1  . Multiple Vitamins-Minerals (MULTIVITAMIN ADULT PO) Take by mouth.    . rosuvastatin (CRESTOR) 20 MG tablet     . traZODone (DESYREL) 50 MG tablet TK 1 T PO AT HOUR OF SLEEP  3  . DULoxetine (CYMBALTA) 60 MG capsule Take 1  capsule (60 mg total) by mouth daily. 90 capsule 2  . traZODone (DESYREL) 50 MG tablet Take 1 tablet (50 mg total) by mouth at bedtime. 90 tablet 2   No current facility-administered medications for this visit.     Medication Side Effects: Other: Dry mouth  Allergies:  Allergies  Allergen Reactions  . Codeine Nausea And Vomiting    Past Medical History:  Diagnosis Date  . Allergic rhinitis   . Asthma   . Glaucoma   . Migraines   . RA (rheumatoid arthritis) (HCC)     Family History  Problem Relation Age of Onset  . Hypertension Mother   . Hypertension Father   . Alcohol abuse Father   . Diabetes type II Brother   . Hypertension Brother   . Allergic rhinitis Daughter   . Anxiety  disorder Daughter   . Allergic rhinitis Son   . Diabetes type II Brother   . Hypertension Brother   . Mood Disorder Sister     Social History   Socioeconomic History  . Marital status: Widowed    Spouse name: Not on file  . Number of children: Not on file  . Years of education: Not on file  . Highest education level: Not on file  Occupational History  . Not on file  Social Needs  . Financial resource strain: Not on file  . Food insecurity:    Worry: Not on file    Inability: Not on file  . Transportation needs:    Medical: Not on file    Non-medical: Not on file  Tobacco Use  . Smoking status: Never Smoker  . Smokeless tobacco: Never Used  Substance and Sexual Activity  . Alcohol use: Yes    Comment: extremely rare  . Drug use: No  . Sexual activity: Not on file  Lifestyle  . Physical activity:    Days per week: Not on file    Minutes per session: Not on file  . Stress: Not on file  Relationships  . Social connections:    Talks on phone: Not on file    Gets together: Not on file    Attends religious service: Not on file    Active member of club or organization: Not on file    Attends meetings of clubs or organizations: Not on file    Relationship status: Not on file  . Intimate partner violence:    Fear of current or ex partner: Not on file    Emotionally abused: Not on file    Physically abused: Not on file    Forced sexual activity: Not on file  Other Topics Concern  . Not on file  Social History Narrative  . Not on file    Past Medical History, Surgical history, Social history, and Family history were reviewed and updated as appropriate.   Please see review of systems for further details on the patient's review from today.   Objective:   Physical Exam:  BP 103/60   Pulse 72   Physical Exam  Constitutional: She is oriented to person, place, and time. She appears well-developed. No distress.  Musculoskeletal: She exhibits no deformity.   Neurological: She is alert and oriented to person, place, and time. Coordination normal.  Psychiatric: She has a normal mood and affect. Her speech is normal and behavior is normal. Judgment and thought content normal. Her mood appears not anxious. Her affect is not angry, not blunt, not labile and not inappropriate. Cognition and memory  are normal. She does not exhibit a depressed mood. She expresses no homicidal and no suicidal ideation. She expresses no suicidal plans and no homicidal plans.  Insight intact. No auditory or visual hallucinations. No delusions.     Lab Review:     Component Value Date/Time   NA 140 11/30/2009 1435   K 4.4 11/30/2009 1435   CL 107 11/30/2009 1435   CO2 26 11/30/2009 1435   GLUCOSE 100 (H) 11/30/2009 1435   BUN 11 11/30/2009 1435   CREATININE 0.77 11/30/2009 1435   CALCIUM 9.4 11/30/2009 1435   PROT 6.9 06/01/2009 0948   ALBUMIN 4.1 06/01/2009 0948   AST 36 06/01/2009 0948   ALT 21 06/01/2009 0948   ALKPHOS 81 06/01/2009 0948   BILITOT 0.6 06/01/2009 0948   GFRNONAA >60 11/30/2009 1435   GFRAA  11/30/2009 1435    >60        The eGFR has been calculated using the MDRD equation. This calculation has not been validated in all clinical situations. eGFR's persistently <60 mL/min signify possible Chronic Kidney Disease.       Component Value Date/Time   WBC 8.9 01/17/2018 0928   WBC 8.2 11/30/2009 1435   RBC 4.38 01/17/2018 0928   RBC 4.28 11/30/2009 1435   HGB 13.8 01/17/2018 0928   HCT 42.0 01/17/2018 0928   PLT 355 01/17/2018 0928   MCV 96 01/17/2018 0928   MCH 31.5 01/17/2018 0928   MCHC 32.9 01/17/2018 0928   MCHC 34.3 11/30/2009 1435   RDW 14.0 01/17/2018 0928   LYMPHSABS 2.0 01/17/2018 0928   MONOABS 1.0 06/09/2009 0441   EOSABS 0.1 01/17/2018 0928   BASOSABS 0.0 01/17/2018 0928    No results found for: POCLITH, LITHIUM   No results found for: PHENYTOIN, PHENOBARB, VALPROATE, CBMZ   .res Assessment: Plan:   Patient seen  for 30 minutes and greater than 50% of visit spent counseling patient to include discussing potential metabolic side effects of Abilify.  Patient reports that she recently had lab work done and that medical provider was pleased with results of lipid panel.  Patient reports that medications are currently controlling target signs and symptoms and she is not having any significant tolerability issues.  She wishes to continue current medications without any changes.  Primary insomnia - Plan: traZODone (DESYREL) 50 MG tablet  Major depression, recurrent, full remission ([place]) - Plan: ARIPiprazole (ABILIFY) 5 MG tablet, DULoxetine (CYMBALTA) 30 MG capsule, DULoxetine (CYMBALTA) 60 MG capsule  Anxiety disorder, unspecified type - Plan: DULoxetine (CYMBALTA) 30 MG capsule, DULoxetine (CYMBALTA) 60 MG capsule  Please see After Visit Summary for patient specific instructions.  Future Appointments  Date Time Provider [place]  05/12/2019  4:00 PM Kennith Gain, MD [facility] None  08/11/2019  5:00 PM Thayer Headings, PMHNP [facility] None    No orders of the defined types were placed in this encounter.     -------------------------------

## 2018-11-24 DIAGNOSIS — M0579 Rheumatoid arthritis with rheumatoid factor of multiple sites without organ or systems involvement: Secondary | ICD-10-CM | POA: Diagnosis not present

## 2018-12-10 DIAGNOSIS — Z79899 Other long term (current) drug therapy: Secondary | ICD-10-CM | POA: Diagnosis not present

## 2018-12-10 DIAGNOSIS — M0579 Rheumatoid arthritis with rheumatoid factor of multiple sites without organ or systems involvement: Secondary | ICD-10-CM | POA: Diagnosis not present

## 2018-12-10 DIAGNOSIS — R748 Abnormal levels of other serum enzymes: Secondary | ICD-10-CM | POA: Diagnosis not present

## 2018-12-10 DIAGNOSIS — M1990 Unspecified osteoarthritis, unspecified site: Secondary | ICD-10-CM | POA: Diagnosis not present

## 2019-01-19 DIAGNOSIS — M0579 Rheumatoid arthritis with rheumatoid factor of multiple sites without organ or systems involvement: Secondary | ICD-10-CM | POA: Diagnosis not present

## 2019-03-11 DIAGNOSIS — E782 Mixed hyperlipidemia: Secondary | ICD-10-CM | POA: Diagnosis not present

## 2019-03-11 DIAGNOSIS — Z79899 Other long term (current) drug therapy: Secondary | ICD-10-CM | POA: Diagnosis not present

## 2019-03-11 DIAGNOSIS — M0579 Rheumatoid arthritis with rheumatoid factor of multiple sites without organ or systems involvement: Secondary | ICD-10-CM | POA: Diagnosis not present

## 2019-03-11 DIAGNOSIS — Z0000 Encounter for general adult medical examination without abnormal findings: Secondary | ICD-10-CM | POA: Diagnosis not present

## 2019-03-11 DIAGNOSIS — M1990 Unspecified osteoarthritis, unspecified site: Secondary | ICD-10-CM | POA: Diagnosis not present

## 2019-03-11 DIAGNOSIS — R748 Abnormal levels of other serum enzymes: Secondary | ICD-10-CM | POA: Diagnosis not present

## 2019-03-16 DIAGNOSIS — M0579 Rheumatoid arthritis with rheumatoid factor of multiple sites without organ or systems involvement: Secondary | ICD-10-CM | POA: Diagnosis not present

## 2019-03-18 DIAGNOSIS — E782 Mixed hyperlipidemia: Secondary | ICD-10-CM | POA: Diagnosis not present

## 2019-03-18 DIAGNOSIS — M0579 Rheumatoid arthritis with rheumatoid factor of multiple sites without organ or systems involvement: Secondary | ICD-10-CM | POA: Diagnosis not present

## 2019-03-18 DIAGNOSIS — Z0000 Encounter for general adult medical examination without abnormal findings: Secondary | ICD-10-CM | POA: Diagnosis not present

## 2019-03-18 DIAGNOSIS — G43909 Migraine, unspecified, not intractable, without status migrainosus: Secondary | ICD-10-CM | POA: Diagnosis not present

## 2019-05-06 DIAGNOSIS — H401131 Primary open-angle glaucoma, bilateral, mild stage: Secondary | ICD-10-CM | POA: Diagnosis not present

## 2019-05-11 ENCOUNTER — Other Ambulatory Visit: Payer: Self-pay | Admitting: Allergy

## 2019-05-11 DIAGNOSIS — M0579 Rheumatoid arthritis with rheumatoid factor of multiple sites without organ or systems involvement: Secondary | ICD-10-CM | POA: Diagnosis not present

## 2019-05-12 ENCOUNTER — Encounter: Payer: Self-pay | Admitting: Allergy

## 2019-05-12 ENCOUNTER — Other Ambulatory Visit: Payer: Self-pay

## 2019-05-12 ENCOUNTER — Ambulatory Visit (INDEPENDENT_AMBULATORY_CARE_PROVIDER_SITE_OTHER): Payer: BLUE CROSS/BLUE SHIELD | Admitting: Allergy

## 2019-05-12 DIAGNOSIS — J4540 Moderate persistent asthma, uncomplicated: Secondary | ICD-10-CM | POA: Diagnosis not present

## 2019-05-12 DIAGNOSIS — J3089 Other allergic rhinitis: Secondary | ICD-10-CM

## 2019-05-12 DIAGNOSIS — G43909 Migraine, unspecified, not intractable, without status migrainosus: Secondary | ICD-10-CM | POA: Diagnosis not present

## 2019-05-12 MED ORDER — MOMETASONE FUROATE 50 MCG/ACT NA SUSP: NASAL | 1 refills | Status: DC

## 2019-05-12 MED ORDER — MONTELUKAST SODIUM 10 MG PO TABS: ORAL_TABLET | ORAL | 1 refills | Status: DC

## 2019-05-12 MED ORDER — CYPROHEPTADINE HCL 4 MG PO TABS: ORAL_TABLET | ORAL | 1 refills | Status: DC

## 2019-05-12 NOTE — Patient Instructions (Addendum)
1. Continue Periactin 6 mg bedtime (1-1/2 tablets)  2. Will try to wean down Symbicort 160 -  Take 1 puff twice a day.    Resume 2 puffs twice a day if any respiratory symptoms arise or you are needing to use your albuterol inhaler.    3.  Continue Singulair 10mg  daily (take at bedtime)   4. Continue Nasonex 1-2 spray each nostril once a day  5. Continue ProAir HFA and antihistamine if needed  Return to clinic in 4-6 months or earlier if problem

## 2019-05-12 NOTE — Progress Notes (Signed)
RE: Susan Castillo MRN: [id number] DOB: [birth year] Date of Telemedicine Visit: 05/12/2019  Referring provider: Merrilee Seashore, MD Primary care provider: Merrilee Seashore, MD  Chief Complaint: Asthma   Telemedicine Follow Up Visit via Telephone: I connected with Susan Castillo for a follow up on 05/12/19 by telephone and verified that I am speaking with the correct person using two identifiers.   I discussed the limitations, risks, security and privacy concerns of performing an evaluation and management service by telephone and the availability of in person appointments. I also discussed with the patient that there may be a patient responsible charge related to this service. The patient expressed understanding and agreed to proceed.  Patient is at home. Provider is at the office.  Visit start time: 1608 Visit end time: 1628 Insurance consent/check in by: Lupita Raider Medical consent and medical assistant/nurse: Girtha Rm  History of Present Illness: She is a 58 y.o. female, who is being followed for asthma, allergic rhinitis and migraines. Her previous allergy office visit was on 11/11/18 with Dr. Nelva Bush.  She has been doing very well since her last visit.  She states the pollen season has not been bothering her at all.  She has not had to take any OTC allergy medications.  She does use her Nasonex daily in the morning and reports that helps to control any nasal symptoms she may have.  She also taking singulair daily.  With her asthma she states she has not had any symptoms at all since last visit.  She even reports being able to walk for exercise without any issues.  She has not needed to use albuterol since last visit.  No ED/UC visits or systemic steroid needs.  She does continue on periactin at night which continues to control her migraines.     Assessment and Plan: [name] is a 58 y.o. female with:   Moderate persistent asthma-doing very well under good control and will  try to step-down therapy.  She would qualify for Xolair if we needed to step up therapy.   Allergic rhinitis-  doing very well with Nasonex use and singulair.  We have discussed adding in Astelin if she does have any nasal drainage component Migraine syndrome-under control with Periactin  1. Continue Periactin 6 mg bedtime (1-1/2 tablets)  2. Will try to wean down Symbicort 160 -  Take 1 puff twice a day.    Resume 2 puffs twice a day if any respiratory symptoms arise or you are needing to use your albuterol inhaler.    3.  Continue Singulair 10mg  daily (take at bedtime)   4. Continue Nasonex 1-2 spray each nostril once a day  5. Continue ProAir HFA and antihistamine if needed  Return to clinic in 4-6 months or earlier if problem  Diagnostics: None.  Medication List:  Current Outpatient Medications  Medication Sig Dispense Refill  . albuterol (PROAIR HFA) 108 (90 Base) MCG/ACT inhaler INHALE 2 PUFFS BY MOUTH EVERY 4-6 HOURS AS NEEDED FOR COUGH OR WHEEZE 18 g 1  . ARIPiprazole (ABILIFY) 5 MG tablet Take 1 tablet (5 mg total) by mouth daily. 90 tablet 2  . budesonide-formoterol (SYMBICORT) 160-4.5 MCG/ACT inhaler Inhale two puffs twice daily to prevent cough or wheeze. Rinse, gargle, and spit after use. 3 Inhaler 1  . Calcium Citrate-Vitamin D (CALCIUM + D PO) Take by mouth.    . cyproheptadine (PERIACTIN) 4 MG tablet Take 1 and 1/2 tablets once daily at bedtime 135 tablet 1  . DULoxetine (  CYMBALTA) 60 MG capsule Take 1 capsule by mouth every morning  3  . folic acid (FOLVITE) 1 MG tablet TK 1 T PO QD  0  . golimumab 2 mg/kg in sodium chloride 0.9 % Inject 2 mg/kg into the vein every 8 (eight) weeks.    Marland Kitchen ketoconazole (NIZORAL) 2 % shampoo USE 2 TO 3 TIMES PER WEEK UTD  3  . latanoprost (XALATAN) 0.005 % ophthalmic solution INT 1 GTT IN OU Q NIGHT UTD  11  . methotrexate (RHEUMATREX) 2.5 MG tablet TK 4 TS PO 1 TIME A WK  1  . mometasone (NASONEX) 50 MCG/ACT nasal spray Use one spray  in each nostril once daily as directed 51 g 1  . montelukast (SINGULAIR) 10 MG tablet TAKE 1 TABLET(10 MG) BY MOUTH AT BEDTIME 90 tablet 0  . Multiple Vitamins-Minerals (MULTIVITAMIN ADULT PO) Take by mouth.    . rosuvastatin (CRESTOR) 20 MG tablet     . traZODone (DESYREL) 50 MG tablet TK 1 T PO AT HOUR OF SLEEP  3  . traZODone (DESYREL) 50 MG tablet Take 1 tablet (50 mg total) by mouth at bedtime. 90 tablet 2   No current facility-administered medications for this visit.    Allergies: Allergies  Allergen Reactions  . Codeine Nausea And Vomiting   I reviewed her past medical history, social history, family history, and environmental history and no significant changes have been reported from previous visit on 11/11/18.  Review of Systems  Constitutional: Negative for chills and fever.  HENT: Negative for congestion, postnasal drip, rhinorrhea and sneezing.   Eyes: Negative for pain, discharge and itching.  Respiratory: Negative for cough, chest tightness, shortness of breath and wheezing.   Cardiovascular: Negative.   Gastrointestinal: Negative.   Musculoskeletal: Negative for myalgias.  Skin: Negative for rash.  Neurological: Negative for headaches.   Objective: Physical Exam Not obtained as encounter was done via telephone.   Previous notes and tests were reviewed.  I discussed the assessment and treatment plan with the patient. The patient was provided an opportunity to ask questions and all were answered. The patient agreed with the plan and demonstrated an understanding of the instructions.   The patient was advised to call back or seek an in-person evaluation if the symptoms worsen or if the condition fails to improve as anticipated.  I provided 20 minutes of non-face-to-face time during this encounter.  It was my pleasure to participate in [name] care today. Please feel free to contact me with any questions or concerns.   Sincerely,  Tareq Dwan Charmian Muff, MD

## 2019-07-06 DIAGNOSIS — M0579 Rheumatoid arthritis with rheumatoid factor of multiple sites without organ or systems involvement: Secondary | ICD-10-CM | POA: Diagnosis not present

## 2019-07-08 DIAGNOSIS — M1990 Unspecified osteoarthritis, unspecified site: Secondary | ICD-10-CM | POA: Diagnosis not present

## 2019-07-08 DIAGNOSIS — Z79899 Other long term (current) drug therapy: Secondary | ICD-10-CM | POA: Diagnosis not present

## 2019-07-08 DIAGNOSIS — R748 Abnormal levels of other serum enzymes: Secondary | ICD-10-CM | POA: Diagnosis not present

## 2019-07-08 DIAGNOSIS — M25569 Pain in unspecified knee: Secondary | ICD-10-CM | POA: Diagnosis not present

## 2019-07-08 DIAGNOSIS — M0579 Rheumatoid arthritis with rheumatoid factor of multiple sites without organ or systems involvement: Secondary | ICD-10-CM | POA: Diagnosis not present

## 2019-08-11 ENCOUNTER — Ambulatory Visit: Payer: BLUE CROSS/BLUE SHIELD | Admitting: Psychiatry

## 2019-08-20 ENCOUNTER — Encounter: Payer: Self-pay | Admitting: Psychiatry

## 2019-08-20 ENCOUNTER — Other Ambulatory Visit: Payer: Self-pay

## 2019-08-20 ENCOUNTER — Ambulatory Visit (INDEPENDENT_AMBULATORY_CARE_PROVIDER_SITE_OTHER): Payer: BC Managed Care – PPO | Admitting: Psychiatry

## 2019-08-20 VITALS — Wt 218.0 lb

## 2019-08-20 DIAGNOSIS — F3342 Major depressive disorder, recurrent, in full remission: Secondary | ICD-10-CM

## 2019-08-20 DIAGNOSIS — F5101 Primary insomnia: Secondary | ICD-10-CM | POA: Diagnosis not present

## 2019-08-20 DIAGNOSIS — F419 Anxiety disorder, unspecified: Secondary | ICD-10-CM | POA: Diagnosis not present

## 2019-08-20 MED ORDER — ARIPIPRAZOLE 5 MG PO TABS: 5.0000 mg | ORAL_TABLET | Freq: Every day | ORAL | 3 refills | Status: DC

## 2019-08-20 MED ORDER — DULOXETINE HCL 30 MG PO CPEP: 30.0000 mg | ORAL_CAPSULE | Freq: Every day | ORAL | 3 refills | Status: DC

## 2019-08-20 MED ORDER — DULOXETINE HCL 60 MG PO CPEP: 60.0000 mg | ORAL_CAPSULE | Freq: Every morning | ORAL | 3 refills | Status: DC

## 2019-08-20 NOTE — Progress Notes (Signed)
Susan Castillo [id number] 1961-10-26 58 y.o.  Subjective:   Patient ID:  Susan Castillo is a 57 y.o. (DOB 21-Jan-1961) female.  Chief Complaint:  Chief Complaint  Patient presents with  . Follow-up    h/o Depression, Anxiety, and Insomnia    HPI Susan Castillo presents to the office today for follow-up of depression and insomnia. Has been working from home and enjoys this. Reports that she has been going to bed a few hours earlier and is waking up at the same time. She reports that she is not sure if this is boredom. She reports that her mood has been good. Denies depressed mood. She reports that she is slightly anxious when driviing on the highway now after not having driven recently. Some anxiety with being around people. Reports that a large SUV swerved towards her when she was walking her dog. Was walking her dog 3 times a day and no longer wants to walk on street and is riding her exercise bike. Reports that she has been eating more and thinks it is out of boredom. Energy and motivation have been good. Concentration has been adequate. Denies SI.  Son is living with her and is returning to school at [employer].   Review of Systems:  Review of Systems  Musculoskeletal: Positive for back pain. Negative for gait problem.  Neurological: Negative for tremors.  Psychiatric/Behavioral:       Please refer to HPI    Medications: I have reviewed the patient's current medications.  Current Outpatient Medications  Medication Sig Dispense Refill  . albuterol (PROAIR HFA) 108 (90 Base) MCG/ACT inhaler INHALE 2 PUFFS BY MOUTH EVERY 4-6 HOURS AS NEEDED FOR COUGH OR WHEEZE 18 g 1  . ARIPiprazole (ABILIFY) 5 MG tablet Take 1 tablet (5 mg total) by mouth daily. 90 tablet 3  . budesonide-formoterol (SYMBICORT) 160-4.5 MCG/ACT inhaler Inhale two puffs twice daily to prevent cough or wheeze. Rinse, gargle, and spit after use. 3 Inhaler 1  . Calcium Citrate-Vitamin D (CALCIUM + D PO) Take by mouth.     . cyproheptadine (PERIACTIN) 4 MG tablet Take 1 and 1/2 tablets once daily at bedtime 135 tablet 1  . DULoxetine (CYMBALTA) 30 MG capsule Take 1 capsule (30 mg total) by mouth daily. 90 capsule 3  . DULoxetine (CYMBALTA) 60 MG capsule Take 1 capsule (60 mg total) by mouth every morning. 90 capsule 3  . folic acid (FOLVITE) 1 MG tablet TK 1 T PO QD  0  . ketoconazole (NIZORAL) 2 % shampoo USE 2 TO 3 TIMES PER WEEK UTD  3  . latanoprost (XALATAN) 0.005 % ophthalmic solution INT 1 GTT IN OU Q NIGHT UTD  11  . methotrexate (RHEUMATREX) 2.5 MG tablet TK 4 TS PO 1 TIME A WK  1  . mometasone (NASONEX) 50 MCG/ACT nasal spray Use one spray in each nostril once daily as directed 51 g 1  . montelukast (SINGULAIR) 10 MG tablet TAKE 1 TABLET(10 MG) BY MOUTH AT BEDTIME 90 tablet 1  . Multiple Vitamins-Minerals (MULTIVITAMIN ADULT PO) Take by mouth.    . rosuvastatin (CRESTOR) 20 MG tablet     . traZODone (DESYREL) 50 MG tablet TK 1 T PO AT HOUR OF SLEEP  3  . golimumab 2 mg/kg in sodium chloride 0.9 % Inject 2 mg/kg into the vein every 8 (eight) weeks.    . traZODone (DESYREL) 50 MG tablet Take 1 tablet (50 mg total) by mouth at bedtime. 90 tablet 2  No current facility-administered medications for this visit.     Medication Side Effects: None  Allergies:  Allergies  Allergen Reactions  . Codeine Nausea And Vomiting    Past Medical History:  Diagnosis Date  . Allergic rhinitis   . Asthma   . Glaucoma   . Migraines   . RA (rheumatoid arthritis) (HCC)     Family History  Problem Relation Age of Onset  . Hypertension Mother   . Hypertension Father   . Alcohol abuse Father   . Diabetes type II Brother   . Hypertension Brother   . Allergic rhinitis Daughter   . Anxiety disorder Daughter   . Allergic rhinitis Son   . Diabetes type II Brother   . Hypertension Brother   . Mood Disorder Sister     Social History   Socioeconomic History  . Marital status: Widowed    Spouse name: Not  on file  . Number of children: Not on file  . Years of education: Not on file  . Highest education level: Not on file  Occupational History  . Not on file  Social Needs  . Financial resource strain: Not on file  . Food insecurity    Worry: Not on file    Inability: Not on file  . Transportation needs    Medical: Not on file    Non-medical: Not on file  Tobacco Use  . Smoking status: Never Smoker  . Smokeless tobacco: Never Used  Substance and Sexual Activity  . Alcohol use: Yes    Comment: extremely rare  . Drug use: No  . Sexual activity: Not on file  Lifestyle  . Physical activity    Days per week: Not on file    Minutes per session: Not on file  . Stress: Not on file  Relationships  . Social Herbalist on phone: Not on file    Gets together: Not on file    Attends religious service: Not on file    Active member of club or organization: Not on file    Attends meetings of clubs or organizations: Not on file    Relationship status: Not on file  . Intimate partner violence    Fear of current or ex partner: Not on file    Emotionally abused: Not on file    Physically abused: Not on file    Forced sexual activity: Not on file  Other Topics Concern  . Not on file  Social History Narrative  . Not on file    Past Medical History, Surgical history, Social history, and Family history were reviewed and updated as appropriate.   Please see review of systems for further details on the patient's review from today.   Objective:   Physical Exam:  Wt 218 lb (98.9 kg)   BMI 37.42 kg/m   Physical Exam Constitutional:      General: She is not in acute distress.    Appearance: She is well-developed.  Musculoskeletal:        General: No deformity.  Neurological:     Mental Status: She is alert and oriented to person, place, and time.     Coordination: Coordination normal.  Psychiatric:        Attention and Perception: Attention and perception normal. She does  not perceive auditory or visual hallucinations.        Mood and Affect: Mood normal. Mood is not anxious or depressed. Affect is not labile, blunt, angry or inappropriate.  Speech: Speech normal.        Behavior: Behavior normal.        Thought Content: Thought content normal. Thought content does not include homicidal or suicidal ideation. Thought content does not include homicidal or suicidal plan.        Cognition and Memory: Cognition and memory normal.        Judgment: Judgment normal.     Comments: Insight intact. No delusions.      Lab Review:     Component Value Date/Time   NA 140 11/30/2009 1435   K 4.4 11/30/2009 1435   CL 107 11/30/2009 1435   CO2 26 11/30/2009 1435   GLUCOSE 100 (H) 11/30/2009 1435   BUN 11 11/30/2009 1435   CREATININE 0.77 11/30/2009 1435   CALCIUM 9.4 11/30/2009 1435   PROT 6.9 06/01/2009 0948   ALBUMIN 4.1 06/01/2009 0948   AST 36 06/01/2009 0948   ALT 21 06/01/2009 0948   ALKPHOS 81 06/01/2009 0948   BILITOT 0.6 06/01/2009 0948   GFRNONAA >60 11/30/2009 1435   GFRAA  11/30/2009 1435    >60        The eGFR has been calculated using the MDRD equation. This calculation has not been validated in all clinical situations. eGFR's persistently <60 mL/min signify possible Chronic Kidney Disease.       Component Value Date/Time   WBC 8.9 01/17/2018 0928   WBC 8.2 11/30/2009 1435   RBC 4.38 01/17/2018 0928   RBC 4.28 11/30/2009 1435   HGB 13.8 01/17/2018 0928   HCT 42.0 01/17/2018 0928   PLT 355 01/17/2018 0928   MCV 96 01/17/2018 0928   MCH 31.5 01/17/2018 0928   MCHC 32.9 01/17/2018 0928   MCHC 34.3 11/30/2009 1435   RDW 14.0 01/17/2018 0928   LYMPHSABS 2.0 01/17/2018 0928   MONOABS 1.0 06/09/2009 0441   EOSABS 0.1 01/17/2018 0928   BASOSABS 0.0 01/17/2018 0928    No results found for: POCLITH, LITHIUM   No results found for: PHENYTOIN, PHENOBARB, VALPROATE, CBMZ  Reports that glucose and lipids were WNL. Reports fasting  glucose was around 87. Reports total cholesterol was around 140. She reports that her HDL was high and LDL was low.  .res Assessment: Plan:   Will continue current plan of care since patient reports that overall mood, anxiety, and insomnia remain well controlled and that occasional mild anxiety seems to be situational and is currently manageable.  Patient advised to contact office if she begins to experience more frequent or severe anxiety. Patient to follow-up in 9 months or sooner if clinically indicated. Patient advised to contact office with any questions, adverse effects, or acute worsening in signs and symptoms.  Khushboo was seen today for follow-up.  Diagnoses and all orders for this visit:  Major depression, recurrent, full remission (HCC) -     ARIPiprazole (ABILIFY) 5 MG tablet; Take 1 tablet (5 mg total) by mouth daily. -     DULoxetine (CYMBALTA) 30 MG capsule; Take 1 capsule (30 mg total) by mouth daily. -     DULoxetine (CYMBALTA) 60 MG capsule; Take 1 capsule (60 mg total) by mouth every morning.  Anxiety disorder, unspecified type  Primary insomnia     Please see After Visit Summary for patient specific instructions.  Future Appointments  Date Time Provider [place]  10/13/2019  3:40 PM Kennith Gain, MD [facility] None  05/19/2020  1:30 PM Thayer Headings, PMHNP [facility] None    No orders of  the defined types were placed in this encounter.   -------------------------------

## 2019-08-20 NOTE — Progress Notes (Signed)
   08/20/19 1521  Facial and Oral Movements  Muscles of Facial Expression 0  Lips and Perioral Area 0  Jaw 0  Tongue 0  Extremity Movements  Upper (arms, wrists, hands, fingers) 0  Lower (legs, knees, ankles, toes) 0  Trunk Movements  Neck, shoulders, hips 0  Overall Severity  Severity of abnormal movements (highest score from questions above) 0  Incapacitation due to abnormal movements 0  Patient's awareness of abnormal movements (rate only patient's report) 0  AIMS Total Score  AIMS Total Score 0

## 2019-09-08 DIAGNOSIS — M0579 Rheumatoid arthritis with rheumatoid factor of multiple sites without organ or systems involvement: Secondary | ICD-10-CM | POA: Diagnosis not present

## 2019-09-16 DIAGNOSIS — L910 Hypertrophic scar: Secondary | ICD-10-CM | POA: Diagnosis not present

## 2019-10-08 ENCOUNTER — Other Ambulatory Visit: Payer: Self-pay | Admitting: Allergy

## 2019-10-08 NOTE — Telephone Encounter (Signed)
Courtesy refill  

## 2019-10-13 ENCOUNTER — Ambulatory Visit: Payer: Self-pay | Admitting: Allergy

## 2019-10-22 DIAGNOSIS — Z7189 Other specified counseling: Secondary | ICD-10-CM | POA: Diagnosis not present

## 2019-10-22 DIAGNOSIS — M545 Low back pain: Secondary | ICD-10-CM | POA: Diagnosis not present

## 2019-10-22 DIAGNOSIS — E782 Mixed hyperlipidemia: Secondary | ICD-10-CM | POA: Diagnosis not present

## 2019-10-22 DIAGNOSIS — M0579 Rheumatoid arthritis with rheumatoid factor of multiple sites without organ or systems involvement: Secondary | ICD-10-CM | POA: Diagnosis not present

## 2019-10-27 ENCOUNTER — Ambulatory Visit (INDEPENDENT_AMBULATORY_CARE_PROVIDER_SITE_OTHER): Payer: BC Managed Care – PPO | Admitting: Allergy

## 2019-10-27 ENCOUNTER — Encounter: Payer: Self-pay | Admitting: Allergy

## 2019-10-27 ENCOUNTER — Other Ambulatory Visit: Payer: Self-pay

## 2019-10-27 VITALS — BP 140/90 | HR 80 | Temp 98.3°F | Resp 16

## 2019-10-27 DIAGNOSIS — J3089 Other allergic rhinitis: Secondary | ICD-10-CM | POA: Diagnosis not present

## 2019-10-27 DIAGNOSIS — G43909 Migraine, unspecified, not intractable, without status migrainosus: Secondary | ICD-10-CM | POA: Diagnosis not present

## 2019-10-27 DIAGNOSIS — J4540 Moderate persistent asthma, uncomplicated: Secondary | ICD-10-CM | POA: Diagnosis not present

## 2019-10-27 NOTE — Patient Instructions (Signed)
1. Continue Periactin 6 mg bedtime (1-1/2 tablets)  2. Continue to wean on maintenance inhaler.   Sample of Symbicort 30mcg provided today.  Use 2 puffs daily.  let us know either way if this controls asthma symptoms or not (if does will send in new prescription for this.  If it does not control your asthma symptoms then resume use of Symbicort 161mcg 2 puffs daily.   3.  Continue Singulair 10mg  daily (take at bedtime)   4. Continue Nasonex 1-2 spray each nostril once a day as need for congestion  5. Continue ProAir HFA and antihistamine if needed  Return to clinic in 4-6 months or earlier if problem

## 2019-10-27 NOTE — Progress Notes (Signed)
Follow-up Note  RE: Susan Castillo MRN: [id number] DOB: [birth year] Date of Office Visit: 10/27/2019   History of present illness: Susan Castillo is a 58 y.o. female presenting today for follow-up of asthma, allergic rhinitis and migraine syndrome.  She was last seen in the office on May 12, 2019 by myself.  She states she has been doing well since this last visit without any major health changes, surgeries or hospitalizations.  In regards to her asthma she states she has been doing well.  She did wean down to Symbicort 160 taking 2 puffs once a day instead of twice a day.  She states she has not had any increase in asthma symptoms and has only required use of her albuterol twice since the last visit.  She states the albuterol which she used in the symptoms she developed while she was cleaning and someone else's home that was musty.  She also continues on Singulair daily.  She has not had any ED or urgent care visits or any systemic steroid needs. In regards to her allergic rhinitis she states she has been doing well and states that this past spring and summer have been the best season she has had in a while.  She has not needed to use her Nasonex.  She does continue to take Periactin at bedtime which helps with her migraine control.   Review of systems: Review of Systems  Constitutional: Negative for chills, fever and malaise/fatigue.  HENT: Negative for congestion, ear discharge, nosebleeds and sore throat.   Eyes: Negative for pain, discharge and redness.  Respiratory: Negative for cough, shortness of breath and wheezing.   Cardiovascular: Negative for chest pain.  Gastrointestinal: Negative for abdominal pain, constipation, diarrhea, heartburn, nausea and vomiting.  Musculoskeletal: Negative for joint pain.  Skin: Negative for itching and rash.  Neurological: Negative for headaches.    All other systems negative unless noted above in HPI  Past medical/social/surgical/family  history have been reviewed and are unchanged unless specifically indicated below.  No changes  Medication List: Current Outpatient Medications  Medication Sig Dispense Refill  . albuterol (PROAIR HFA) 108 (90 Base) MCG/ACT inhaler INHALE 2 PUFFS BY MOUTH EVERY 4-6 HOURS AS NEEDED FOR COUGH OR WHEEZE 18 g 1  . ARIPiprazole (ABILIFY) 5 MG tablet Take 1 tablet (5 mg total) by mouth daily. 90 tablet 3  . budesonide-formoterol (SYMBICORT) 160-4.5 MCG/ACT inhaler INHALE 2 PUFFS TWICE DAILY TO PREVENT COUGH OR WHEEZE. RINSE, GARGLE AND SPIT AFTER USE 10.2 g 1  . Calcium Citrate-Vitamin D (CALCIUM + D PO) Take by mouth.    . cyproheptadine (PERIACTIN) 4 MG tablet Take 1 and 1/2 tablets once daily at bedtime 135 tablet 1  . DULoxetine (CYMBALTA) 30 MG capsule Take 1 capsule (30 mg total) by mouth daily. 90 capsule 3  . DULoxetine (CYMBALTA) 60 MG capsule Take 1 capsule (60 mg total) by mouth every morning. 90 capsule 3  . folic acid (FOLVITE) 1 MG tablet TK 1 T PO QD  0  . golimumab 2 mg/kg in sodium chloride 0.9 % Inject 2 mg/kg into the vein every 8 (eight) weeks.    Marland Kitchen ketoconazole (NIZORAL) 2 % shampoo USE 2 TO 3 TIMES PER WEEK UTD  3  . latanoprost (XALATAN) 0.005 % ophthalmic solution INT 1 GTT IN OU Q NIGHT UTD  11  . methotrexate (RHEUMATREX) 2.5 MG tablet TK 4 TS PO 1 TIME A WK  1  . mometasone (NASONEX) 50 MCG/ACT  nasal spray Use one spray in each nostril once daily as directed 51 g 1  . montelukast (SINGULAIR) 10 MG tablet TAKE 1 TABLET(10 MG) BY MOUTH AT BEDTIME 90 tablet 1  . Multiple Vitamins-Minerals (MULTIVITAMIN ADULT PO) Take by mouth.    . rosuvastatin (CRESTOR) 20 MG tablet     . traZODone (DESYREL) 50 MG tablet TK 1 T PO AT HOUR OF SLEEP  3  . traZODone (DESYREL) 50 MG tablet Take 1 tablet (50 mg total) by mouth at bedtime. 90 tablet 2   No current facility-administered medications for this visit.      Known medication allergies: Allergies  Allergen Reactions  . Codeine  Nausea And Vomiting     Physical examination: Blood pressure 140/90, pulse 80, temperature 98.3 F (36.8 C), temperature source Temporal, resp. rate 16, SpO2 95 %.  General: Alert, interactive, in no acute distress. HEENT: PERRLA, TMs pearly gray, turbinates non-edematous without discharge, post-pharynx unremarkable. Neck: Supple without lymphadenopathy. Lungs: Clear to auscultation without wheezing, rhonchi or rales. {no increased work of breathing. CV: Normal S1, S2 without murmurs. Abdomen: Nondistended, nontender. Skin: Warm and dry, without lesions or rashes. Extremities:  No clubbing, cyanosis or edema. Neuro:   Grossly intact.  Diagnositics/Labs: None today  Assessment and plan: Moderate persistent asthma-doing very well under good control and will try to further step-down therapy.  She would qualify for Xolair if we needed to step up therapy.   Allergic rhinitis-  doing very well with as needed Nasonex use and daily singulair and periactin.  We have discussed adding in Astelin if she does have any nasal drainage component Migraine syndrome-under control with Periactin    1. Continue Periactin 6 mg bedtime (1-1/2 tablets)  2. Continue to wean on maintenance inhaler.   Sample of Symbicort 17mcg provided today.  Use 2 puffs daily.  let us know either way if this controls asthma symptoms or not (if does will send in new prescription for this.  If it does not control your asthma symptoms then resume use of Symbicort 185mcg 2 puffs daily.   3.  Continue Singulair 10mg  daily (take at bedtime)   4. Continue Nasonex 1-2 spray each nostril once a day as need for congestion  5. Continue ProAir HFA and antihistamine if needed  Return to clinic in 4-6 months or earlier if problem   I appreciate the opportunity to take part in [name]'s care. Please do not hesitate to contact me with questions.  Sincerely,   Prudy Feeler, MD Allergy/Immunology [facility]

## 2019-11-03 DIAGNOSIS — M0579 Rheumatoid arthritis with rheumatoid factor of multiple sites without organ or systems involvement: Secondary | ICD-10-CM | POA: Diagnosis not present

## 2019-11-09 DIAGNOSIS — M0579 Rheumatoid arthritis with rheumatoid factor of multiple sites without organ or systems involvement: Secondary | ICD-10-CM | POA: Diagnosis not present

## 2019-11-09 DIAGNOSIS — Z79899 Other long term (current) drug therapy: Secondary | ICD-10-CM | POA: Diagnosis not present

## 2019-11-09 DIAGNOSIS — M1990 Unspecified osteoarthritis, unspecified site: Secondary | ICD-10-CM | POA: Diagnosis not present

## 2019-11-09 DIAGNOSIS — R748 Abnormal levels of other serum enzymes: Secondary | ICD-10-CM | POA: Diagnosis not present

## 2019-11-10 DIAGNOSIS — L299 Pruritus, unspecified: Secondary | ICD-10-CM | POA: Diagnosis not present

## 2019-11-10 DIAGNOSIS — L910 Hypertrophic scar: Secondary | ICD-10-CM | POA: Diagnosis not present

## 2019-11-10 DIAGNOSIS — L719 Rosacea, unspecified: Secondary | ICD-10-CM | POA: Diagnosis not present

## 2019-11-23 DIAGNOSIS — Z79899 Other long term (current) drug therapy: Secondary | ICD-10-CM | POA: Diagnosis not present

## 2019-11-23 DIAGNOSIS — G609 Hereditary and idiopathic neuropathy, unspecified: Secondary | ICD-10-CM | POA: Diagnosis not present

## 2019-11-23 DIAGNOSIS — M48062 Spinal stenosis, lumbar region with neurogenic claudication: Secondary | ICD-10-CM | POA: Diagnosis not present

## 2019-11-23 DIAGNOSIS — M961 Postlaminectomy syndrome, not elsewhere classified: Secondary | ICD-10-CM | POA: Diagnosis not present

## 2019-11-23 DIAGNOSIS — Z5181 Encounter for therapeutic drug level monitoring: Secondary | ICD-10-CM | POA: Diagnosis not present

## 2019-12-07 DIAGNOSIS — M0579 Rheumatoid arthritis with rheumatoid factor of multiple sites without organ or systems involvement: Secondary | ICD-10-CM | POA: Diagnosis not present

## 2019-12-09 DIAGNOSIS — J3489 Other specified disorders of nose and nasal sinuses: Secondary | ICD-10-CM | POA: Diagnosis not present

## 2019-12-09 DIAGNOSIS — Z20828 Contact with and (suspected) exposure to other viral communicable diseases: Secondary | ICD-10-CM | POA: Diagnosis not present

## 2019-12-10 DIAGNOSIS — M5137 Other intervertebral disc degeneration, lumbosacral region: Secondary | ICD-10-CM | POA: Diagnosis not present

## 2019-12-10 DIAGNOSIS — M47816 Spondylosis without myelopathy or radiculopathy, lumbar region: Secondary | ICD-10-CM | POA: Diagnosis not present

## 2019-12-10 DIAGNOSIS — M2578 Osteophyte, vertebrae: Secondary | ICD-10-CM | POA: Diagnosis not present

## 2019-12-10 DIAGNOSIS — M47817 Spondylosis without myelopathy or radiculopathy, lumbosacral region: Secondary | ICD-10-CM | POA: Diagnosis not present

## 2019-12-10 DIAGNOSIS — M545 Low back pain: Secondary | ICD-10-CM | POA: Diagnosis not present

## 2019-12-10 DIAGNOSIS — M4316 Spondylolisthesis, lumbar region: Secondary | ICD-10-CM | POA: Diagnosis not present

## 2019-12-17 DIAGNOSIS — R748 Abnormal levels of other serum enzymes: Secondary | ICD-10-CM | POA: Diagnosis not present

## 2019-12-17 DIAGNOSIS — K228 Other specified diseases of esophagus: Secondary | ICD-10-CM | POA: Diagnosis not present

## 2019-12-21 DIAGNOSIS — Z79899 Other long term (current) drug therapy: Secondary | ICD-10-CM | POA: Diagnosis not present

## 2019-12-21 DIAGNOSIS — M5030 Other cervical disc degeneration, unspecified cervical region: Secondary | ICD-10-CM | POA: Diagnosis not present

## 2019-12-21 DIAGNOSIS — Z5181 Encounter for therapeutic drug level monitoring: Secondary | ICD-10-CM | POA: Diagnosis not present

## 2019-12-21 DIAGNOSIS — M48062 Spinal stenosis, lumbar region with neurogenic claudication: Secondary | ICD-10-CM | POA: Diagnosis not present

## 2019-12-21 DIAGNOSIS — G894 Chronic pain syndrome: Secondary | ICD-10-CM | POA: Diagnosis not present

## 2019-12-21 DIAGNOSIS — M5417 Radiculopathy, lumbosacral region: Secondary | ICD-10-CM | POA: Diagnosis not present

## 2019-12-29 ENCOUNTER — Other Ambulatory Visit: Payer: Self-pay

## 2019-12-29 DIAGNOSIS — M0579 Rheumatoid arthritis with rheumatoid factor of multiple sites without organ or systems involvement: Secondary | ICD-10-CM | POA: Diagnosis not present

## 2019-12-29 DIAGNOSIS — F5101 Primary insomnia: Secondary | ICD-10-CM

## 2019-12-29 MED ORDER — TRAZODONE HCL 50 MG PO TABS: 50.0000 mg | ORAL_TABLET | Freq: Every day | ORAL | 2 refills | Status: DC

## 2019-12-30 ENCOUNTER — Telehealth: Payer: Self-pay | Admitting: Allergy

## 2019-12-30 DIAGNOSIS — L719 Rosacea, unspecified: Secondary | ICD-10-CM | POA: Diagnosis not present

## 2019-12-30 NOTE — Telephone Encounter (Signed)
Ok thanks.  We attempted to wean and not controlling symptoms thus will keep at previous dose of symbicort 18mcg.

## 2019-12-30 NOTE — Telephone Encounter (Signed)
Susan Castillo called in and states she was trying the Symbicort 80 and wasn't "able to breathe" so she had to go back to Symbicort 160 and just wanted to update Dr. Nelva Bush.

## 2020-01-04 DIAGNOSIS — Z20828 Contact with and (suspected) exposure to other viral communicable diseases: Secondary | ICD-10-CM | POA: Diagnosis not present

## 2020-01-13 DIAGNOSIS — M961 Postlaminectomy syndrome, not elsewhere classified: Secondary | ICD-10-CM | POA: Diagnosis not present

## 2020-01-13 DIAGNOSIS — M5417 Radiculopathy, lumbosacral region: Secondary | ICD-10-CM | POA: Diagnosis not present

## 2020-01-13 DIAGNOSIS — G894 Chronic pain syndrome: Secondary | ICD-10-CM | POA: Diagnosis not present

## 2020-01-13 DIAGNOSIS — M48062 Spinal stenosis, lumbar region with neurogenic claudication: Secondary | ICD-10-CM | POA: Diagnosis not present

## 2020-01-17 ENCOUNTER — Other Ambulatory Visit: Payer: Self-pay | Admitting: Allergy

## 2020-02-08 DIAGNOSIS — M1990 Unspecified osteoarthritis, unspecified site: Secondary | ICD-10-CM | POA: Diagnosis not present

## 2020-02-08 DIAGNOSIS — Z79899 Other long term (current) drug therapy: Secondary | ICD-10-CM | POA: Diagnosis not present

## 2020-02-08 DIAGNOSIS — M0579 Rheumatoid arthritis with rheumatoid factor of multiple sites without organ or systems involvement: Secondary | ICD-10-CM | POA: Diagnosis not present

## 2020-02-08 DIAGNOSIS — M25569 Pain in unspecified knee: Secondary | ICD-10-CM | POA: Diagnosis not present

## 2020-02-08 DIAGNOSIS — R748 Abnormal levels of other serum enzymes: Secondary | ICD-10-CM | POA: Diagnosis not present

## 2020-02-10 DIAGNOSIS — M5030 Other cervical disc degeneration, unspecified cervical region: Secondary | ICD-10-CM | POA: Diagnosis not present

## 2020-02-10 DIAGNOSIS — M48062 Spinal stenosis, lumbar region with neurogenic claudication: Secondary | ICD-10-CM | POA: Diagnosis not present

## 2020-02-10 DIAGNOSIS — G609 Hereditary and idiopathic neuropathy, unspecified: Secondary | ICD-10-CM | POA: Diagnosis not present

## 2020-02-10 DIAGNOSIS — M961 Postlaminectomy syndrome, not elsewhere classified: Secondary | ICD-10-CM | POA: Diagnosis not present

## 2020-02-25 DIAGNOSIS — M0579 Rheumatoid arthritis with rheumatoid factor of multiple sites without organ or systems involvement: Secondary | ICD-10-CM | POA: Diagnosis not present

## 2020-03-24 DIAGNOSIS — Z0000 Encounter for general adult medical examination without abnormal findings: Secondary | ICD-10-CM | POA: Diagnosis not present

## 2020-03-24 DIAGNOSIS — E782 Mixed hyperlipidemia: Secondary | ICD-10-CM | POA: Diagnosis not present

## 2020-03-24 DIAGNOSIS — Z79899 Other long term (current) drug therapy: Secondary | ICD-10-CM | POA: Diagnosis not present

## 2020-03-29 ENCOUNTER — Ambulatory Visit: Payer: BC Managed Care – PPO | Admitting: Allergy

## 2020-03-31 ENCOUNTER — Other Ambulatory Visit: Payer: Self-pay | Admitting: Allergy

## 2020-04-12 ENCOUNTER — Ambulatory Visit (INDEPENDENT_AMBULATORY_CARE_PROVIDER_SITE_OTHER): Payer: BC Managed Care – PPO | Admitting: Allergy

## 2020-04-12 ENCOUNTER — Other Ambulatory Visit: Payer: Self-pay

## 2020-04-12 ENCOUNTER — Encounter: Payer: Self-pay | Admitting: Allergy

## 2020-04-12 VITALS — BP 122/78 | HR 81 | Temp 98.0°F | Resp 18 | Ht 65.0 in | Wt 216.8 lb

## 2020-04-12 DIAGNOSIS — G43909 Migraine, unspecified, not intractable, without status migrainosus: Secondary | ICD-10-CM | POA: Diagnosis not present

## 2020-04-12 DIAGNOSIS — J3089 Other allergic rhinitis: Secondary | ICD-10-CM

## 2020-04-12 DIAGNOSIS — J4540 Moderate persistent asthma, uncomplicated: Secondary | ICD-10-CM | POA: Diagnosis not present

## 2020-04-12 DIAGNOSIS — J45909 Unspecified asthma, uncomplicated: Secondary | ICD-10-CM | POA: Insufficient documentation

## 2020-04-12 MED ORDER — MOMETASONE FUROATE 50 MCG/ACT NA SUSP: NASAL | 1 refills | Status: DC

## 2020-04-12 MED ORDER — SYMBICORT 160-4.5 MCG/ACT IN AERO: INHALATION_SPRAY | RESPIRATORY_TRACT | 5 refills | Status: DC

## 2020-04-12 MED ORDER — ALBUTEROL SULFATE HFA 108 (90 BASE) MCG/ACT IN AERS: INHALATION_SPRAY | RESPIRATORY_TRACT | 1 refills | Status: DC

## 2020-04-12 MED ORDER — CYPROHEPTADINE HCL 4 MG PO TABS: ORAL_TABLET | ORAL | 3 refills | Status: DC

## 2020-04-12 MED ORDER — MONTELUKAST SODIUM 10 MG PO TABS: ORAL_TABLET | ORAL | 1 refills | Status: DC

## 2020-04-12 NOTE — Patient Instructions (Addendum)
Allergic Rhinitis Continue Nasonex 1-2 sprays each nostril once a day as needed for stuffy nose. Continue montelukast 10 mg one tablet once a day for allergy symptoms May use an over the counter antihistamine such as Zyrtec, Allegra, Xyzal or Claritin once a day as needed for runny nose or itching.  Asthma Continue Symbicort 160- take 2 puffs once a day. Rinse mouth out after to help prevent thrush. Use albuterol 2 puffs every 4-6 hours as needed for coughing, wheezing, tightness in chest or shortness of breath. Also may use albuterol 2 puffs 5-15 minutes prior to exercise. Asthma control goals:   Full participation in all desired activities (may need albuterol before activity)  Albuterol use two time or less a week on average (not counting use with activity)  Cough interfering with sleep two time or less a month  Oral steroids no more than once a year  No hospitalizations  Migraine Syndrome Continue Periactin 6 mg ( 1-1/2 tablets) at bedtime.  Continue all other medications. Please let us know if this treatment plan is not working well for you.  Schedule follow up in 6 months

## 2020-04-12 NOTE — Progress Notes (Signed)
[address] [place] [address] Dept: [phone]  FOLLOW UP NOTE  Patient ID: Susan Castillo, female    DOB: 1961/05/03  Age: 59 y.o. MRN: [id number] Date of Office Visit: 04/12/2020  Assessment  Chief Complaint: Asthma  HPI CHARLY PETTAWAY presents for follow up of allergic rhinitis, asthma and migraine syndrome. Her last appointment was on 10/27/2019 with Dr. Bary Leriche. Allergic rhinitis is reported as well controlled with the use Nasonex 1 spray each nostril once a day as needed and montelukast 10 mg once a day. She has not had to use an antihistamine yet this season. She reports that she only has to blow her nose in the morning and denies any stuffy nose, sneezing, or itchy/watery eyes. Asthma is reported as well controlled with the use of Symbicort 160/4.5- 2 puffs once a day and albuterol as needed. She has only had to use her rescue inhaler once in the past month. She reports a little bit of tightness in her chest due to pollen and denies coughing, wheezing and shortness of breath. Since her last appointment she has not required any extra systemic steroids other than the 3mg  she takes once a day for her rheumatoid arthritis. She also has not made any trips to the emergency room or urgent care since her last appointment. Migraines are reported as controlled with Periactin 6 mg at night. Her last migraine was last Friday, but she reports that she is having less than one migraine a month.   Drug Allergies:  Allergies  Allergen Reactions  . Codeine Nausea And Vomiting    Physical Exam: BP 122/78   Pulse 81   Temp 98 F (36.7 C) (Temporal)   Resp 18   Ht 5\' 5"  (1.651 m)   Wt 216 lb 12.8 oz (98.3 kg)   SpO2 91%   BMI 36.08 kg/m    Physical Exam Vitals and nursing note reviewed.  Constitutional:      Appearance: Normal appearance.  HENT:     Head: Normocephalic and atraumatic.     Comments: Pharynx normal. Eyes normal. Ears normal. Nasal cavity: mild erythema     Right Ear: Tympanic membrane, ear canal and external ear normal.     Left Ear: Tympanic membrane, ear canal and external ear normal.     Mouth/Throat:     Mouth: Mucous membranes are moist.  Eyes:     Conjunctiva/sclera: Conjunctivae normal.  Cardiovascular:     Rate and Rhythm: Normal rate and regular rhythm.     Heart sounds: Normal heart sounds.  Pulmonary:     Effort: Pulmonary effort is normal.     Breath sounds: Normal breath sounds.     Comments: Lungs clear to auscultation. Skin:    General: Skin is warm.  Neurological:     General: No focal deficit present.     Mental Status: She is alert and oriented to person, place, and time.  Psychiatric:        Mood and Affect: Mood normal.        Behavior: Behavior normal.        Thought Content: Thought content normal.        Judgment: Judgment normal.     Diagnostics: FVC 3.33 L, FEV1 2.97 L predicted FVC 3.50 L, FEV1 2.73 L.  Spirometry indicates normal ventilatory function.    Assessment and Plan: 1. Asthma, moderate persistent, well-controlled   2. Other allergic rhinitis   3. Migraine syndrome     Allergic Rhinitis  Continue Nasonex 1-2 sprays each nostril once a day as needed for stuffy nose. Continue montelukast 10 mg one tablet once a day for allergy symptoms May use an over the counter antihistamine such as Zyrtec, Allegra, Xyzal or Claritin once a day as needed for runny nose or itching.  Asthma Continue Symbicort 160- take 2 puffs once a day. Rinse mouth out after to help prevent thrush. Use albuterol 2 puffs every 4-6 hours as needed for coughing, wheezing, tightness in chest or shortness of breath. Also may use albuterol 2 puffs 5-15 minutes prior to exercise. Asthma control goals:   Full participation in all desired activities (may need albuterol before activity)  Albuterol use two time or less a week on average (not counting use with activity)  Cough interfering with sleep two time or less a  month  Oral steroids no more than once a year  No hospitalizations  Migraine Syndrome Continue Periactin 6 mg ( 1-1/2 tablets) at bedtime  Continue all other medications. Please let us know if this treatment plan is not working well for you.  Schedule follow up in 6 months  Thank you for the opportunity to care for this patient.  Please do not hesitate to contact me with questions.  Althea Charon, FNP [facility] [place]  Addendum:  I performed/discussed the history and physical examination of the patient as well as management with NP Ambs. I reviewed the NP's note and agree with the documented findings and plan of care with following additions/exceptions: none  Prudy Feeler, MD [facility] [place]

## 2020-04-12 NOTE — Progress Notes (Signed)
[address] [place] [address] Dept: [phone]  FOLLOW UP NOTE  Patient ID: Susan Castillo, female    DOB: 1961/07/17  Age: 59 y.o. MRN: [id number] Date of Office Visit: 04/12/2020  Assessment  Chief Complaint: Asthma  HPI [name] presents for follow up of allergic rhinitis, asthma and migraine syndrome. Her last appointment was on 10/27/2019 with Dr. Bary Leriche. Allergic rhinitis is reported as well controlled with the use Nasonex 1 spray each nostril once a day as needed and montelukast 10 mg once a day. She has not had to use an antihistamine yet this season. She reports that she only has to blow her nose in the morning and denies any stuffy nose, sneezing, or itchy/watery eyes. Asthma is reported as well controlled with the use of Symbicort 160/4.5- 2 puffs once a day and albuterol as needed. She has only had to use her rescue inhaler once in the past month. She reports a little bit of tightness in her chest due to pollen and denies coughing, wheezing and shortness of breath. Since her last appointment she has not required any extra systemic steroids other than the 3mg  she takes once a day for her rheumatoid arthritis. She also has not made any trips to the emergency room or urgent care since her last appointment. Migraines are reported as controlled with Periactin 6 mg at night. Her last migraine was last Friday, but she reports that she is having less than one migraine a month.   Drug Allergies:  Allergies  Allergen Reactions  . Codeine Nausea And Vomiting    Physical Exam: BP 122/78   Pulse 81   Temp 98 F (36.7 C) (Temporal)   Resp 18   Ht 5\' 5"  (1.651 m)   Wt 216 lb 12.8 oz (98.3 kg)   SpO2 91%   BMI 36.08 kg/m    Physical Exam Vitals and nursing note reviewed.  Constitutional:      Appearance: Normal appearance.  HENT:     Head: Normocephalic and atraumatic.     Comments: Pharynx normal. Eyes normal. Ears normal. Nasal cavity: mild erythema  Right Ear: Tympanic membrane, ear canal and external ear normal.     Left Ear: Tympanic membrane, ear canal and external ear normal.     Mouth/Throat:     Mouth: Mucous membranes are moist.  Eyes:     Conjunctiva/sclera: Conjunctivae normal.  Cardiovascular:     Rate and Rhythm: Normal rate and regular rhythm.     Heart sounds: Normal heart sounds.  Pulmonary:     Effort: Pulmonary effort is normal.     Breath sounds: Normal breath sounds.     Comments: Lungs clear to auscultation. Skin:    General: Skin is warm.  Neurological:     General: No focal deficit present.     Mental Status: She is alert and oriented to person, place, and time.  Psychiatric:        Mood and Affect: Mood normal.        Behavior: Behavior normal.        Thought Content: Thought content normal.        Judgment: Judgment normal.     Diagnostics: FVC 3.33 L, FEV1 2.97 L predicted FVC 3.50 L, FEV1 2.73 L.  Spirometry indicates normal ventilatory function.    Assessment and Plan: 1. Asthma, moderate persistent, well-controlled   2. Other allergic rhinitis   3. Migraine syndrome     Allergic Rhinitis Continue Nasonex 1-2  sprays each nostril once a day as needed for stuffy nose. Continue montelukast 10 mg one tablet once a day for allergy symptoms May use an over the counter antihistamine such as Zyrtec, Allegra, Xyzal or Claritin once a day as needed for runny nose or itching.  Asthma Continue Symbicort 160- take 2 puffs once a day. Rinse mouth out after to help prevent thrush. Use albuterol 2 puffs every 4-6 hours as needed for coughing, wheezing, tightness in chest or shortness of breath. Also may use albuterol 2 puffs 5-15 minutes prior to exercise. Asthma control goals:   Full participation in all desired activities (may need albuterol before activity)  Albuterol use two time or less a week on average (not counting use with activity)  Cough interfering with sleep two time or less a month   Oral steroids no more than once a year  No hospitalizations  Migraine Syndrome Continue Periactin 6 mg ( 1-1/2 tablets) at bedtime  Continue all other medications. Please let us know if this treatment plan is not working well for you.  Schedule follow up in 6 months  Thank you for the opportunity to care for this patient.  Please do not hesitate to contact me with questions.  Althea Charon, FNP [facility] [place]  Addendum:  I performed/discussed the history and physical examination of the patient as well as management with NP Ambs. I reviewed the NP's note and agree with the documented findings and plan of care with following additions/exceptions: none  Prudy Feeler, MD [facility] [place]

## 2020-04-13 ENCOUNTER — Encounter: Payer: Self-pay | Admitting: *Deleted

## 2020-04-21 DIAGNOSIS — M0579 Rheumatoid arthritis with rheumatoid factor of multiple sites without organ or systems involvement: Secondary | ICD-10-CM | POA: Diagnosis not present

## 2020-04-28 ENCOUNTER — Other Ambulatory Visit: Payer: Self-pay

## 2020-04-28 DIAGNOSIS — M1990 Unspecified osteoarthritis, unspecified site: Secondary | ICD-10-CM | POA: Insufficient documentation

## 2020-04-29 ENCOUNTER — Ambulatory Visit (INDEPENDENT_AMBULATORY_CARE_PROVIDER_SITE_OTHER): Payer: BC Managed Care – PPO

## 2020-04-29 ENCOUNTER — Encounter: Payer: Self-pay | Admitting: Sports Medicine

## 2020-04-29 ENCOUNTER — Other Ambulatory Visit: Payer: Self-pay | Admitting: Sports Medicine

## 2020-04-29 ENCOUNTER — Ambulatory Visit (INDEPENDENT_AMBULATORY_CARE_PROVIDER_SITE_OTHER): Payer: BC Managed Care – PPO | Admitting: Sports Medicine

## 2020-04-29 ENCOUNTER — Other Ambulatory Visit: Payer: Self-pay

## 2020-04-29 DIAGNOSIS — M792 Neuralgia and neuritis, unspecified: Secondary | ICD-10-CM | POA: Diagnosis not present

## 2020-04-29 DIAGNOSIS — G5752 Tarsal tunnel syndrome, left lower limb: Secondary | ICD-10-CM | POA: Diagnosis not present

## 2020-04-29 DIAGNOSIS — M79672 Pain in left foot: Secondary | ICD-10-CM

## 2020-04-29 DIAGNOSIS — D3610 Benign neoplasm of peripheral nerves and autonomic nervous system, unspecified: Secondary | ICD-10-CM | POA: Diagnosis not present

## 2020-04-29 DIAGNOSIS — M7742 Metatarsalgia, left foot: Secondary | ICD-10-CM | POA: Diagnosis not present

## 2020-04-29 MED ORDER — TRIAMCINOLONE ACETONIDE 10 MG/ML IJ SUSP
10.0000 mg | Freq: Once | INTRAMUSCULAR | Status: AC
  Administered 2020-04-29: 10 mg

## 2020-04-29 NOTE — Progress Notes (Signed)
Subjective: Susan Castillo is a 59 y.o. female patient who presents to office for evaluation of left foot pain. Patient complains of progressive pain especially over the last 6 to 8 months that has slowly been getting worse in the left foot at the ball reports that she has a history of sciatica and has always had issues for years with her left foot and has had spinal block as well as a nerve stimulator placed by her back doctor. Ranks pain 7-8/10 and is now interferring with daily activities cannot walk attempted the mall due to the pain reports that when the pain is really bad her third fourth and fifth toes go numb. Patient has tried aspirin, roll-on pain cream, Advil and Tylenol with no relief in symptoms. Patient denies any other pedal complaints. Denies injury/trip/fall/sprain/any causative factors.   Review of Systems  All other systems reviewed and are negative.   Patient Active Problem List   Diagnosis Date Noted  . Osteoarthritis 04/28/2020  . Asthma, well controlled 04/12/2020  . Other allergic rhinitis 04/12/2020  . Migraine syndrome 04/12/2020  . Cervical radicular pain 07/14/2013  . Degenerative disc disease, lumbar 01/13/2013  . Lumbosacral radiculopathy 01/13/2013  . Pain syndrome, chronic 01/13/2013    Current Outpatient Medications on File Prior to Visit  Medication Sig Dispense Refill  . albuterol (PROAIR HFA) 108 (90 Base) MCG/ACT inhaler INHALE 2 PUFFS BY MOUTH EVERY 4-6 HOURS AS NEEDED FOR COUGH OR WHEEZE 18 g 1  . ARIPiprazole (ABILIFY) 5 MG tablet Take 1 tablet (5 mg total) by mouth daily. 90 tablet 3  . budesonide-formoterol (SYMBICORT) 160-4.5 MCG/ACT inhaler INHALE 2 PUFFS TWICE DAILY TO PREVENT COUGH OR WHEEZE. RINSE, GARGLE AND SPIT AFTER USE 10.2 g 1  . Calcium Citrate-Vitamin D (CALCIUM + D PO) Take by mouth.    . cyproheptadine (PERIACTIN) 4 MG tablet TAKE 1 AND 1/2 TABLETS BY MOUTH EVERY DAY AT BEDTIME 135 tablet 3  . DULoxetine (CYMBALTA) 30 MG capsule  Take 1 capsule (30 mg total) by mouth daily. 90 capsule 3  . DULoxetine (CYMBALTA) 60 MG capsule Take 1 capsule (60 mg total) by mouth every morning. 90 capsule 3  . folic acid (FOLVITE) 1 MG tablet TK 1 T PO QD  0  . golimumab 2 mg/kg in sodium chloride 0.9 % Inject 2 mg/kg into the vein every 8 (eight) weeks.    . hydroxychloroquine (PLAQUENIL) 200 MG tablet Take 400 mg by mouth daily.    Marland Kitchen ketoconazole (NIZORAL) 2 % shampoo USE 2 TO 3 TIMES PER WEEK UTD  3  . latanoprost (XALATAN) 0.005 % ophthalmic solution INT 1 GTT IN OU Q NIGHT UTD  11  . methotrexate (RHEUMATREX) 2.5 MG tablet TK 4 TS PO 1 TIME A WK  1  . mometasone (NASONEX) 50 MCG/ACT nasal spray Use 1-2 sprays in each nostril once daily as needed 51 g 1  . montelukast (SINGULAIR) 10 MG tablet Take 1 tablet by mouth once daily 90 tablet 1  . Multiple Vitamins-Minerals (MULTIVITAMIN ADULT PO) Take by mouth.    . predniSONE (DELTASONE) 1 MG tablet Take 3 mg by mouth daily.    . rosuvastatin (CRESTOR) 20 MG tablet     . SYMBICORT 160-4.5 MCG/ACT inhaler Inhale 2 puffs into the lungs once daily 1 Inhaler 5  . traZODone (DESYREL) 50 MG tablet Take 1 tablet (50 mg total) by mouth at bedtime. 90 tablet 2   No current facility-administered medications on file prior to visit.  Allergies  Allergen Reactions  . Codeine Nausea And Vomiting    Objective:  General: Alert and oriented x3 in no acute distress  Dermatology: No open lesions bilateral lower extremities, no webspace macerations, no ecchymosis bilateral, all nails x 10 are well manicured.  Vascular: Dorsalis Pedis and Posterior Tibial pedal pulses palpable, Capillary Fill Time 3 seconds,(+) pedal hair growth bilateral, no edema bilateral lower extremities, Temperature gradient within normal limits.  Neurology: Gross sensation intact via light touch bilateral, Vibratory sensation intact bilateral.  Negative Tinel's sign.  Positive Mulder sign.  Musculoskeletal: Mild tenderness  with palpation at ball of left foot, second third and fourth interspaces on the left.  Mild hammertoe. Gait: Antalgic gait  Xrays  Left foot   Impression: Normal osseous mineralization, enlarged second metatarsal head, positive Sullivan sign, hammertoe deformity.  No fracture.  Soft tissue margins within normal limits.  Assessment and Plan: Problem List Items Addressed This Visit    None    Visit Diagnoses    Left foot pain    -  Primary   Neuritis       Tarsal tunnel syndrome of left side           -Complete examination performed -Xrays reviewed -Discussed treatement options for likely neuritis secondary to neuroma versus radiculopathy with tarsal tunnel component -After oral consent and aseptic prep, injected a mixture containing 1 ml of 2% plain lidocaine, 1 ml 0.5% plain marcaine, 0.5 ml of kenalog 10 and 0.5 ml of dexamethasone phosphate into left second through fourth interspaces without complication. Post-injection care discussed with patient.  -Dispensed metatarsal pad to use at all times when standing or walking -Advised patient if pain fails to improve may consider cam boot versus MRI -Patient to return to office 3-4 weeks or sooner if condition worsens.  Landis Martins, DPM

## 2020-05-02 DIAGNOSIS — E782 Mixed hyperlipidemia: Secondary | ICD-10-CM | POA: Diagnosis not present

## 2020-05-02 DIAGNOSIS — Z0000 Encounter for general adult medical examination without abnormal findings: Secondary | ICD-10-CM | POA: Diagnosis not present

## 2020-05-02 DIAGNOSIS — K760 Fatty (change of) liver, not elsewhere classified: Secondary | ICD-10-CM | POA: Diagnosis not present

## 2020-05-02 DIAGNOSIS — M0579 Rheumatoid arthritis with rheumatoid factor of multiple sites without organ or systems involvement: Secondary | ICD-10-CM | POA: Diagnosis not present

## 2020-05-06 ENCOUNTER — Other Ambulatory Visit: Payer: Self-pay | Admitting: Sports Medicine

## 2020-05-06 DIAGNOSIS — M792 Neuralgia and neuritis, unspecified: Secondary | ICD-10-CM

## 2020-05-09 DIAGNOSIS — R748 Abnormal levels of other serum enzymes: Secondary | ICD-10-CM | POA: Diagnosis not present

## 2020-05-09 DIAGNOSIS — M1990 Unspecified osteoarthritis, unspecified site: Secondary | ICD-10-CM | POA: Diagnosis not present

## 2020-05-09 DIAGNOSIS — M0579 Rheumatoid arthritis with rheumatoid factor of multiple sites without organ or systems involvement: Secondary | ICD-10-CM | POA: Diagnosis not present

## 2020-05-09 DIAGNOSIS — Z79899 Other long term (current) drug therapy: Secondary | ICD-10-CM | POA: Diagnosis not present

## 2020-05-11 DIAGNOSIS — L209 Atopic dermatitis, unspecified: Secondary | ICD-10-CM | POA: Diagnosis not present

## 2020-05-19 ENCOUNTER — Other Ambulatory Visit: Payer: Self-pay

## 2020-05-19 ENCOUNTER — Encounter: Payer: Self-pay | Admitting: Psychiatry

## 2020-05-19 ENCOUNTER — Ambulatory Visit (INDEPENDENT_AMBULATORY_CARE_PROVIDER_SITE_OTHER): Payer: BC Managed Care – PPO | Admitting: Psychiatry

## 2020-05-19 VITALS — Wt 217.0 lb

## 2020-05-19 DIAGNOSIS — F5101 Primary insomnia: Secondary | ICD-10-CM | POA: Diagnosis not present

## 2020-05-19 DIAGNOSIS — F3342 Major depressive disorder, recurrent, in full remission: Secondary | ICD-10-CM | POA: Diagnosis not present

## 2020-05-19 DIAGNOSIS — F419 Anxiety disorder, unspecified: Secondary | ICD-10-CM

## 2020-05-19 MED ORDER — TRAZODONE HCL 50 MG PO TABS: 50.0000 mg | ORAL_TABLET | Freq: Every day | ORAL | 3 refills | Status: DC

## 2020-05-19 MED ORDER — DULOXETINE HCL 60 MG PO CPEP: 60.0000 mg | ORAL_CAPSULE | Freq: Every morning | ORAL | 3 refills | Status: DC

## 2020-05-19 MED ORDER — DULOXETINE HCL 30 MG PO CPEP: 30.0000 mg | ORAL_CAPSULE | Freq: Every day | ORAL | 3 refills | Status: DC

## 2020-05-19 MED ORDER — ARIPIPRAZOLE 5 MG PO TABS: 5.0000 mg | ORAL_TABLET | Freq: Every day | ORAL | 3 refills | Status: DC

## 2020-05-19 NOTE — Progress Notes (Signed)
Susan Castillo [id number] Jun 08, 1961 59 y.o.  Subjective:   Patient ID:  Susan Castillo is a 59 y.o. (DOB 04/21/1961) female.  Chief Complaint:  Chief Complaint  Patient presents with  . Follow-up    h/o Depression, Anxiety, and insomia    HPI Susan Castillo presents to the office today for follow-up of depression, anxiety, and insomnia. She learned the end of April that her job of 14 years will be eliminiated and last day will be June 30th.  She has been interviewing for several positions and has some anxiety about finding a job. She has been working on some projects at work. She has had some GI s/s and Headaches with situational anxiety. Has been worried about the the future.  She had a few sleepless nights after learning about job being eliminated. "I've been too anxious to feel depressed." She reports that she was tearful the first 2 days.  She reports that when she is anxious she is "up and down" and cannot rest and energy is higher. She reports that concentration is poor when she tries to read for pleasure and cannot concentrate on a book and will read social media instead. Able to read materials pertaining to work. She reports that her appetite has been deceased with stress. Denies SI.   Son is living with her currently. He is taking classes remotely. She has been continuing to work remotely.   Past Medication Trials: Cymbalta-effective Concerta Abilify Trazodone Rexulti   AIMS     Office Visit from 08/20/2019 in [facility] Total Score  0       Review of Systems:  Review of Systems  Respiratory:       Asthma has been well controlled.   Musculoskeletal: Negative for gait problem.       She reports that RA has been better controlled.   Allergic/Immunologic:       Allergies have been well controlled  Neurological: Negative for tremors.  Psychiatric/Behavioral:       Please refer to HPI   Had recent annual physical exam and reports that  labs were WNL to include cholesterol.    Medications: I have reviewed the patient's current medications.  Current Outpatient Medications  Medication Sig Dispense Refill  . albuterol (PROAIR HFA) 108 (90 Base) MCG/ACT inhaler INHALE 2 PUFFS BY MOUTH EVERY 4-6 HOURS AS NEEDED FOR COUGH OR WHEEZE 18 g 1  . ARIPiprazole (ABILIFY) 5 MG tablet Take 1 tablet (5 mg total) by mouth daily. 90 tablet 3  . budesonide-formoterol (SYMBICORT) 160-4.5 MCG/ACT inhaler INHALE 2 PUFFS TWICE DAILY TO PREVENT COUGH OR WHEEZE. RINSE, GARGLE AND SPIT AFTER USE 10.2 g 1  . Calcium Citrate-Vitamin D (CALCIUM + D PO) Take by mouth.    . cyproheptadine (PERIACTIN) 4 MG tablet TAKE 1 AND 1/2 TABLETS BY MOUTH EVERY DAY AT BEDTIME 135 tablet 3  . hydroxychloroquine (PLAQUENIL) 200 MG tablet Take 400 mg by mouth daily.    Marland Kitchen latanoprost (XALATAN) 0.005 % ophthalmic solution INT 1 GTT IN OU Q NIGHT UTD  11  . mometasone (NASONEX) 50 MCG/ACT nasal spray Use 1-2 sprays in each nostril once daily as needed 51 g 1  . montelukast (SINGULAIR) 10 MG tablet Take 1 tablet by mouth once daily 90 tablet 1  . Multiple Vitamins-Minerals (MULTIVITAMIN ADULT PO) Take by mouth.    . predniSONE (DELTASONE) 1 MG tablet Take 3 mg by mouth daily. Taking 3 mg po qd for one week,  and then alternating with 2 mg po qd    . rosuvastatin (CRESTOR) 20 MG tablet     . SYMBICORT 160-4.5 MCG/ACT inhaler Inhale 2 puffs into the lungs once daily 1 Inhaler 5  . traZODone (DESYREL) 50 MG tablet Take 1 tablet (50 mg total) by mouth at bedtime. 90 tablet 3  . DULoxetine (CYMBALTA) 30 MG capsule Take 1 capsule (30 mg total) by mouth daily. 90 capsule 3  . DULoxetine (CYMBALTA) 60 MG capsule Take 1 capsule (60 mg total) by mouth every morning. 90 capsule 3  . ketoconazole (NIZORAL) 2 % shampoo USE 2 TO 3 TIMES PER WEEK UTD  3   No current facility-administered medications for this visit.    Medication Side Effects: None  Allergies:  Allergies  Allergen  Reactions  . Codeine Nausea And Vomiting    Past Medical History:  Diagnosis Date  . Allergic rhinitis   . Asthma   . Glaucoma   . Migraines   . RA (rheumatoid arthritis) (HCC)     Family History  Problem Relation Age of Onset  . Hypertension Mother   . Hypertension Father   . Alcohol abuse Father   . Diabetes type II Brother   . Hypertension Brother   . Allergic rhinitis Daughter   . Anxiety disorder Daughter   . Allergic rhinitis Son   . Diabetes type II Brother   . Hypertension Brother   . Mood Disorder Sister     Social History   Socioeconomic History  . Marital status: Widowed    Spouse name: Not on file  . Number of children: Not on file  . Years of education: Not on file  . Highest education level: Not on file  Occupational History  . Not on file  Tobacco Use  . Smoking status: Never Smoker  . Smokeless tobacco: Never Used  Substance and Sexual Activity  . Alcohol use: Yes    Comment: extremely rare  . Drug use: No  . Sexual activity: Not on file  Other Topics Concern  . Not on file  Social History Narrative  . Not on file   Social Determinants of Health   Financial Resource Strain:   . Difficulty of Paying Living Expenses:   Food Insecurity:   . Worried About Charity fundraiser in the Last Year:   . Arboriculturist in the Last Year:   Transportation Needs:   . Film/video editor (Medical):   Marland Kitchen Lack of Transportation (Non-Medical):   Physical Activity:   . Days of Exercise per Week:   . Minutes of Exercise per Session:   Stress:   . Feeling of Stress :   Social Connections:   . Frequency of Communication with Friends and Family:   . Frequency of Social Gatherings with Friends and Family:   . Attends Religious Services:   . Active Member of Clubs or Organizations:   . Attends Archivist Meetings:   Marland Kitchen Marital Status:   Intimate Partner Violence:   . Fear of Current or Ex-Partner:   . Emotionally Abused:   Marland Kitchen Physically  Abused:   . Sexually Abused:     Past Medical History, Surgical history, Social history, and Family history were reviewed and updated as appropriate.   Please see review of systems for further details on the patient's review from today.   Objective:   Physical Exam:  Wt 217 lb (98.4 kg)   BMI 36.11 kg/m   Physical  Exam  Lab Review:     Component Value Date/Time   NA 140 11/30/2009 1435   K 4.4 11/30/2009 1435   CL 107 11/30/2009 1435   CO2 26 11/30/2009 1435   GLUCOSE 100 (H) 11/30/2009 1435   BUN 11 11/30/2009 1435   CREATININE 0.77 11/30/2009 1435   CALCIUM 9.4 11/30/2009 1435   PROT 6.9 06/01/2009 0948   ALBUMIN 4.1 06/01/2009 0948   AST 36 06/01/2009 0948   ALT 21 06/01/2009 0948   ALKPHOS 81 06/01/2009 0948   BILITOT 0.6 06/01/2009 0948   GFRNONAA >60 11/30/2009 1435   GFRAA  11/30/2009 1435    >60        The eGFR has been calculated using the MDRD equation. This calculation has not been validated in all clinical situations. eGFR's persistently <60 mL/min signify possible Chronic Kidney Disease.       Component Value Date/Time   WBC 8.9 01/17/2018 0928   WBC 8.2 11/30/2009 1435   RBC 4.38 01/17/2018 0928   RBC 4.28 11/30/2009 1435   HGB 13.8 01/17/2018 0928   HCT 42.0 01/17/2018 0928   PLT 355 01/17/2018 0928   MCV 96 01/17/2018 0928   MCH 31.5 01/17/2018 0928   MCHC 32.9 01/17/2018 0928   MCHC 34.3 11/30/2009 1435   RDW 14.0 01/17/2018 0928   LYMPHSABS 2.0 01/17/2018 0928   MONOABS 1.0 06/09/2009 0441   EOSABS 0.1 01/17/2018 0928   BASOSABS 0.0 01/17/2018 0928    No results found for: POCLITH, LITHIUM   No results found for: PHENYTOIN, PHENOBARB, VALPROATE, CBMZ   .res Assessment: Plan:   Patient reports that she would like to continue current medications without any changes since she attributes current anxiety to situational stress with her job being eliminated, and reports that her mood and anxiety signs and symptoms had previously been  well controlled. Continue Abilify 5 mg daily for depression. Continue Cymbalta 90 mg daily for depression and anxiety. Continue trazodone 50 mg at bedtime for insomnia. Patient to follow-up in 9 months or sooner if clinically indicated. Patient advised to contact office with any questions, adverse effects, or acute worsening in signs and symptoms.  Erick was seen today for follow-up.  Diagnoses and all orders for this visit:  Anxiety disorder, unspecified type  Major depression, recurrent, full remission (HCC) -     ARIPiprazole (ABILIFY) 5 MG tablet; Take 1 tablet (5 mg total) by mouth daily. -     DULoxetine (CYMBALTA) 30 MG capsule; Take 1 capsule (30 mg total) by mouth daily. -     DULoxetine (CYMBALTA) 60 MG capsule; Take 1 capsule (60 mg total) by mouth every morning.  Primary insomnia -     traZODone (DESYREL) 50 MG tablet; Take 1 tablet (50 mg total) by mouth at bedtime.     Please see After Visit Summary for patient specific instructions.  Future Appointments  Date Time Provider [place]  05/27/2020  8:15 AM Landis Martins, DPM [facility]  10/11/2020  2:00 PM Kennith Gain, MD [facility] None  02/20/2021  8:30 AM Thayer Headings, PMHNP [facility] None    No orders of the defined types were placed in this encounter.   -------------------------------

## 2020-05-27 ENCOUNTER — Telehealth: Payer: Self-pay | Admitting: *Deleted

## 2020-05-27 ENCOUNTER — Encounter: Payer: Self-pay | Admitting: Sports Medicine

## 2020-05-27 ENCOUNTER — Other Ambulatory Visit: Payer: Self-pay

## 2020-05-27 ENCOUNTER — Ambulatory Visit (INDEPENDENT_AMBULATORY_CARE_PROVIDER_SITE_OTHER): Payer: BC Managed Care – PPO | Admitting: Sports Medicine

## 2020-05-27 DIAGNOSIS — G5752 Tarsal tunnel syndrome, left lower limb: Secondary | ICD-10-CM

## 2020-05-27 DIAGNOSIS — D3610 Benign neoplasm of peripheral nerves and autonomic nervous system, unspecified: Secondary | ICD-10-CM | POA: Diagnosis not present

## 2020-05-27 DIAGNOSIS — M79672 Pain in left foot: Secondary | ICD-10-CM

## 2020-05-27 DIAGNOSIS — M7742 Metatarsalgia, left foot: Secondary | ICD-10-CM

## 2020-05-27 DIAGNOSIS — M792 Neuralgia and neuritis, unspecified: Secondary | ICD-10-CM

## 2020-05-27 NOTE — Progress Notes (Signed)
Subjective: Susan Castillo is a 59 y.o. female patient who returns to office for follow-up evaluation of left foot pain. Patient reports that she still has the same pain in the ball of her left foot that radiates up to the toes with the toes go numb reports that when she is wearing the metatarsal padding it takes away the pain at the ball but the pain seems like it is burning out underneath the toes with numbness at the second third and fourth toes.  Patient denies any redness warmth swelling drainage or any other symptoms at this time.   Patient Active Problem List   Diagnosis Date Noted  . Osteoarthritis 04/28/2020  . Asthma, well controlled 04/12/2020  . Other allergic rhinitis 04/12/2020  . Migraine syndrome 04/12/2020  . Cervical radicular pain 07/14/2013  . Degenerative disc disease, lumbar 01/13/2013  . Lumbosacral radiculopathy 01/13/2013  . Pain syndrome, chronic 01/13/2013    Current Outpatient Medications on File Prior to Visit  Medication Sig Dispense Refill  . albuterol (PROAIR HFA) 108 (90 Base) MCG/ACT inhaler INHALE 2 PUFFS BY MOUTH EVERY 4-6 HOURS AS NEEDED FOR COUGH OR WHEEZE 18 g 1  . ARIPiprazole (ABILIFY) 5 MG tablet Take 1 tablet (5 mg total) by mouth daily. 90 tablet 3  . budesonide-formoterol (SYMBICORT) 160-4.5 MCG/ACT inhaler INHALE 2 PUFFS TWICE DAILY TO PREVENT COUGH OR WHEEZE. RINSE, GARGLE AND SPIT AFTER USE 10.2 g 1  . Calcium Citrate-Vitamin D (CALCIUM + D PO) Take by mouth.    . cyproheptadine (PERIACTIN) 4 MG tablet TAKE 1 AND 1/2 TABLETS BY MOUTH EVERY DAY AT BEDTIME 135 tablet 3  . DULoxetine (CYMBALTA) 30 MG capsule Take 1 capsule (30 mg total) by mouth daily. 90 capsule 3  . DULoxetine (CYMBALTA) 60 MG capsule Take 1 capsule (60 mg total) by mouth every morning. 90 capsule 3  . hydroxychloroquine (PLAQUENIL) 200 MG tablet Take 400 mg by mouth daily.    Marland Kitchen ketoconazole (NIZORAL) 2 % shampoo USE 2 TO 3 TIMES PER WEEK UTD  3  . latanoprost (XALATAN)  0.005 % ophthalmic solution INT 1 GTT IN OU Q NIGHT UTD  11  . mometasone (NASONEX) 50 MCG/ACT nasal spray Use 1-2 sprays in each nostril once daily as needed 51 g 1  . montelukast (SINGULAIR) 10 MG tablet Take 1 tablet by mouth once daily 90 tablet 1  . Multiple Vitamins-Minerals (MULTIVITAMIN ADULT PO) Take by mouth.    . predniSONE (DELTASONE) 1 MG tablet Take 3 mg by mouth daily. Taking 3 mg po qd for one week, and then alternating with 2 mg po qd    . rosuvastatin (CRESTOR) 20 MG tablet     . SYMBICORT 160-4.5 MCG/ACT inhaler Inhale 2 puffs into the lungs once daily 1 Inhaler 5  . traZODone (DESYREL) 50 MG tablet Take 1 tablet (50 mg total) by mouth at bedtime. 90 tablet 3   No current facility-administered medications on file prior to visit.    Allergies  Allergen Reactions  . Codeine Nausea And Vomiting    Objective:  General: Alert and oriented x3 in no acute distress  Dermatology: No open lesions bilateral lower extremities, no webspace macerations, no ecchymosis bilateral, all nails x 10 are well manicured.  Vascular: Dorsalis Pedis and Posterior Tibial pedal pulses palpable, Capillary Fill Time 3 seconds,(+) pedal hair growth bilateral, no edema bilateral lower extremities, Temperature gradient within normal limits.  Neurology: Gross sensation intact via light touch bilateral, Vibratory sensation intact bilateral.  Negative Tinel's sign.  Positive Mulder sign.  Musculoskeletal: Mild tenderness with palpation at ball of left foot, second third and fourth interspaces on the left.  Mild hammertoe.  Gait: Antalgic gait with history of sciatica   Assessment and Plan: Problem List Items Addressed This Visit    None    Visit Diagnoses    Neuroma    -  Primary   Metatarsalgia of left foot       Tarsal tunnel syndrome of left side       Left foot pain           -Complete examination performed -Discussed treatement options for likely neuritis secondary to neuroma versus  radiculopathy with tarsal tunnel component that is mixed in nature not improved with aggressive care of steroid injection and metatarsal offloading padding -Short cam boot -We will proceed with ordering MRI for further evaluation left forefoot for neuroma for possible surgery planning -Patient to return to office after MRI or sooner if condition worsens.  Landis Martins, DPM

## 2020-05-27 NOTE — Telephone Encounter (Signed)
-----   Message from [place], [place] sent at 05/27/2020  8:27 AM EDT ----- Regarding: MRI L foot Pain and numbness to toes on left not improved with injection and offloading padding

## 2020-05-27 NOTE — Telephone Encounter (Signed)
Peer to Peer is required for Lt foot MRI w/o contrast Number to perform Peer to Peer is [id number] Reference number is Member's ID

## 2020-05-27 NOTE — Telephone Encounter (Signed)
Peer-to-peer completed. Order [id number] Valid 05/27/2020 to 06/25/2020

## 2020-05-27 NOTE — Telephone Encounter (Signed)
Faxed orders to Dr. Leeanne Rio CMA for pre-cert and scheduling.

## 2020-05-31 NOTE — Telephone Encounter (Signed)
Peer-to-Peer completed by Dr. Young Berry Exp:6/26 Pt's MRI appt of Lt foot w/o contrast was scheduled was scheduled at [facility] for June 4th, checking in at 2 for 2:15 appt. Order form was faxed to [place] and pt was notified of appt date and time

## 2020-06-06 ENCOUNTER — Telehealth: Payer: Self-pay

## 2020-06-06 DIAGNOSIS — D3610 Benign neoplasm of peripheral nerves and autonomic nervous system, unspecified: Secondary | ICD-10-CM

## 2020-06-06 DIAGNOSIS — M792 Neuralgia and neuritis, unspecified: Secondary | ICD-10-CM

## 2020-06-06 NOTE — Telephone Encounter (Signed)
Melissa from [place] called stating they had to cx pt's MRI appt since the pt had a stimulator put in and it's not safe to perform the MRI

## 2020-06-06 NOTE — Telephone Encounter (Signed)
Will you let patient know that I did not realize that she had a stimulator and that I'm sorry she could not get a MRI. If she would like we can try to get a ultrasound to see the nerves and the spaces in between the joints which may be helpful for surgery planning if she fails to improve or get better. -Dr. Chauncey Cruel

## 2020-06-07 NOTE — Telephone Encounter (Signed)
Susan Castillo Can you order [facility] ultrasound of her foot to evaluate interspaces and to check for inflammed nerves (neuroma) on her left forefoot since her MRI could not be done due to pacemaker -Dr. Cannon Kettle

## 2020-06-07 NOTE — Telephone Encounter (Addendum)
Orders faxed to [facility]. 

## 2020-06-07 NOTE — Telephone Encounter (Signed)
Pt states she is intrested in getting an US done. Please advise

## 2020-06-07 NOTE — Addendum Note (Signed)
Addended by: Harriett Sine D on: 06/07/2020 01:24 PM   Modules accepted: Orders

## 2020-06-07 NOTE — Telephone Encounter (Signed)
I informed pt of Dr. Loretha Brasil orders and [facility] did not perform the [place] and she would have to schedule with [facility] [phone].

## 2020-06-14 ENCOUNTER — Other Ambulatory Visit: Payer: Self-pay | Admitting: Sports Medicine

## 2020-06-14 DIAGNOSIS — M792 Neuralgia and neuritis, unspecified: Secondary | ICD-10-CM

## 2020-06-14 DIAGNOSIS — D3610 Benign neoplasm of peripheral nerves and autonomic nervous system, unspecified: Secondary | ICD-10-CM

## 2020-06-16 DIAGNOSIS — M0579 Rheumatoid arthritis with rheumatoid factor of multiple sites without organ or systems involvement: Secondary | ICD-10-CM | POA: Diagnosis not present

## 2020-06-27 ENCOUNTER — Other Ambulatory Visit: Payer: BLUE CROSS/BLUE SHIELD

## 2020-06-30 ENCOUNTER — Other Ambulatory Visit: Payer: Self-pay | Admitting: Allergy

## 2020-07-11 ENCOUNTER — Other Ambulatory Visit: Payer: BC Managed Care – PPO

## 2020-07-11 ENCOUNTER — Ambulatory Visit
Admission: RE | Admit: 2020-07-11 | Discharge: 2020-07-11 | Disposition: A | Discharge status: Home / Self Care | Payer: BC Managed Care – PPO | Source: Ambulatory Visit | Attending: Sports Medicine | Admitting: Sports Medicine

## 2020-07-11 DIAGNOSIS — G5762 Lesion of plantar nerve, left lower limb: Secondary | ICD-10-CM | POA: Diagnosis not present

## 2020-07-11 DIAGNOSIS — R202 Paresthesia of skin: Secondary | ICD-10-CM | POA: Diagnosis not present

## 2020-07-11 DIAGNOSIS — M792 Neuralgia and neuritis, unspecified: Secondary | ICD-10-CM

## 2020-07-11 DIAGNOSIS — R200 Anesthesia of skin: Secondary | ICD-10-CM | POA: Diagnosis not present

## 2020-07-11 DIAGNOSIS — D3610 Benign neoplasm of peripheral nerves and autonomic nervous system, unspecified: Secondary | ICD-10-CM

## 2020-07-27 ENCOUNTER — Telehealth: Payer: Self-pay | Admitting: Urology

## 2020-07-27 NOTE — Telephone Encounter (Signed)
Pt called wanting results from ultrasound, call back # [phone]. Thanks.

## 2020-07-27 NOTE — Telephone Encounter (Signed)
I called patient and left a voicemail with the results -Dr. Cannon Kettle

## 2020-08-16 DIAGNOSIS — M1712 Unilateral primary osteoarthritis, left knee: Secondary | ICD-10-CM | POA: Diagnosis not present

## 2020-08-16 DIAGNOSIS — M1711 Unilateral primary osteoarthritis, right knee: Secondary | ICD-10-CM | POA: Diagnosis not present

## 2020-08-18 ENCOUNTER — Telehealth: Payer: Self-pay | Admitting: Urology

## 2020-08-18 NOTE — Telephone Encounter (Signed)
I called pt and let her know that you called her on 7/28 and left a voicemail. Pt said she didn't get the message if you could call her again.

## 2020-08-18 NOTE — Telephone Encounter (Signed)
I called and discussed results with the patient. I advised her to call the office to get set up for an appointment to come in for injections to treat her neuroma. -Dr. Chauncey Cruel

## 2020-08-18 NOTE — Telephone Encounter (Signed)
Pt would like results from ultrasound and wants to talk about what to do next, pt says this is the second message she has left and is frustrated bc her foot is getting worse.Please Advise.

## 2020-08-26 ENCOUNTER — Encounter: Payer: Self-pay | Admitting: Sports Medicine

## 2020-08-26 ENCOUNTER — Ambulatory Visit (INDEPENDENT_AMBULATORY_CARE_PROVIDER_SITE_OTHER): Payer: 59 | Admitting: Sports Medicine

## 2020-08-26 ENCOUNTER — Other Ambulatory Visit: Payer: Self-pay

## 2020-08-26 DIAGNOSIS — D3610 Benign neoplasm of peripheral nerves and autonomic nervous system, unspecified: Secondary | ICD-10-CM

## 2020-08-26 DIAGNOSIS — M7742 Metatarsalgia, left foot: Secondary | ICD-10-CM

## 2020-08-26 DIAGNOSIS — M792 Neuralgia and neuritis, unspecified: Secondary | ICD-10-CM

## 2020-08-26 DIAGNOSIS — M79672 Pain in left foot: Secondary | ICD-10-CM

## 2020-08-26 NOTE — Progress Notes (Signed)
Subjective: Susan Castillo is a 59 y.o. female patient who returns to office for follow-up evaluation of left foot pain. Patient reports that still the same or may be getting a bit worse reports that she uses the boot when she goes out and does not use it at home pain is 7 out of 10 and start around 10 AM with numbness at second third and fourth toes.  Patient denies any changes to medications or health since last encounter.  Patient Active Problem List   Diagnosis Date Noted  . Osteoarthritis 04/28/2020  . Asthma, well controlled 04/12/2020  . Other allergic rhinitis 04/12/2020  . Migraine syndrome 04/12/2020  . Cervical radicular pain 07/14/2013  . Degenerative disc disease, lumbar 01/13/2013  . Lumbosacral radiculopathy 01/13/2013  . Pain syndrome, chronic 01/13/2013    Current Outpatient Medications on File Prior to Visit  Medication Sig Dispense Refill  . albuterol (PROAIR HFA) 108 (90 Base) MCG/ACT inhaler INHALE 2 PUFFS BY MOUTH EVERY 4-6 HOURS AS NEEDED FOR COUGH OR WHEEZE 18 g 1  . ARIPiprazole (ABILIFY) 5 MG tablet Take 1 tablet (5 mg total) by mouth daily. 90 tablet 3  . budesonide-formoterol (SYMBICORT) 160-4.5 MCG/ACT inhaler INHALE 2 PUFFS TWICE DAILY TO PREVENT COUGH OR WHEEZE. RINSE, GARGLE AND SPIT AFTER USE 10.2 g 1  . Calcium Citrate-Vitamin D (CALCIUM + D PO) Take by mouth.    . cyproheptadine (PERIACTIN) 4 MG tablet TAKE 1 AND 1/2 TABLETS BY MOUTH EVERY DAY AT BEDTIME 135 tablet 3  . DULoxetine (CYMBALTA) 30 MG capsule Take 1 capsule (30 mg total) by mouth daily. 90 capsule 3  . DULoxetine (CYMBALTA) 60 MG capsule Take 1 capsule (60 mg total) by mouth every morning. 90 capsule 3  . hydroxychloroquine (PLAQUENIL) 200 MG tablet Take 400 mg by mouth daily.    Marland Kitchen ketoconazole (NIZORAL) 2 % shampoo USE 2 TO 3 TIMES PER WEEK UTD  3  . latanoprost (XALATAN) 0.005 % ophthalmic solution INT 1 GTT IN OU Q NIGHT UTD  11  . mometasone (NASONEX) 50 MCG/ACT nasal spray Use 1-2  sprays in each nostril once daily as needed 51 g 1  . montelukast (SINGULAIR) 10 MG tablet Take 1 tablet by mouth once daily 90 tablet 1  . Multiple Vitamins-Minerals (MULTIVITAMIN ADULT PO) Take by mouth.    . predniSONE (DELTASONE) 1 MG tablet Take 3 mg by mouth daily. Taking 3 mg po qd for one week, and then alternating with 2 mg po qd    . rosuvastatin (CRESTOR) 20 MG tablet     . SYMBICORT 160-4.5 MCG/ACT inhaler Inhale 2 puffs into the lungs once daily 1 Inhaler 5  . traZODone (DESYREL) 50 MG tablet Take 1 tablet (50 mg total) by mouth at bedtime. 90 tablet 3   No current facility-administered medications on file prior to visit.    Allergies  Allergen Reactions  . Codeine Nausea And Vomiting    Objective:  General: Alert and oriented x3 in no acute distress  Dermatology: No open lesions bilateral lower extremities, no webspace macerations, no ecchymosis bilateral, all nails x 10 are well manicured.  Vascular: Dorsalis Pedis and Posterior Tibial pedal pulses palpable, Capillary Fill Time 3 seconds,(+) pedal hair growth bilateral, no edema bilateral lower extremities, Temperature gradient within normal limits.  Neurology: Gross sensation intact via light touch bilateral, Vibratory sensation intact bilateral.  Negative Tinel's sign.  Positive Mulder sign like before.  Musculoskeletal: Mild tenderness with palpation at ball of left foot,  second third and fourth interspaces on the left with worse pain at third interspace on left.  Mild hammertoe.  Gait: Antalgic gait with history of sciatica and right knee problems most recent injection by orthopedic was 1 week ago   Assessment and Plan: Problem List Items Addressed This Visit    None    Visit Diagnoses    Neuroma    -  Primary   Neuritis       Metatarsalgia of left foot       Left foot pain           -Complete examination performed -Discussed treatement options for neuroma with ultrasound confirmation of a small neuroma  and the other webspace left foot -After oral consent and aseptic prep, injected a mixture containing 1 ml of 2%  plain lidocaine, 1 ml 0.5% plain marcaine, 1 ml of dexamethasone phosphate into left 3rd webspace without complication. Post-injection care discussed with patient.  -Recommend icing tonight post injection for 20 mins -Continue with Short cam boot when she is doing a lot of walking and standing -Advised patient in her home keep with offloading padding if she can not wear her cam boot in her home -Return in 2 weeks for injection series on left for neuroma. Landis Martins, DPM

## 2020-09-07 ENCOUNTER — Encounter: Payer: Self-pay | Admitting: Allergy

## 2020-09-09 ENCOUNTER — Other Ambulatory Visit: Payer: Self-pay

## 2020-09-09 ENCOUNTER — Ambulatory Visit (INDEPENDENT_AMBULATORY_CARE_PROVIDER_SITE_OTHER): Payer: 59 | Admitting: Sports Medicine

## 2020-09-09 ENCOUNTER — Encounter: Payer: Self-pay | Admitting: Sports Medicine

## 2020-09-09 DIAGNOSIS — M79672 Pain in left foot: Secondary | ICD-10-CM

## 2020-09-09 DIAGNOSIS — D3610 Benign neoplasm of peripheral nerves and autonomic nervous system, unspecified: Secondary | ICD-10-CM | POA: Diagnosis not present

## 2020-09-09 DIAGNOSIS — M7742 Metatarsalgia, left foot: Secondary | ICD-10-CM

## 2020-09-09 DIAGNOSIS — M792 Neuralgia and neuritis, unspecified: Secondary | ICD-10-CM

## 2020-09-09 MED ORDER — DEXAMETHASONE SODIUM PHOSPHATE 120 MG/30ML IJ SOLN
4.0000 mg | Freq: Once | INTRAMUSCULAR | Status: AC
  Administered 2020-09-09: 4 mg via INTRA_ARTICULAR

## 2020-09-09 NOTE — Progress Notes (Signed)
Subjective: Susan Castillo is a 59 y.o. female patient who returns to office for follow-up evaluation of left foot pain. Patient reports that pain seems to be some better is getting some relief after first injection for neuroma performed on last visit.  Patient reports that she is still wearing her cam boot when she is doing a lot of walking and she still wearing her gel pads all day.  Patient does admit that after the injection she ice for multiple days because of the bruising and then after about the third day the area seemed to feel much better.  Patient reports that she forgot her pain journal at home.  Denies any other pedal complaints at this time.  Patient Active Problem List   Diagnosis Date Noted   Osteoarthritis 04/28/2020   Asthma, well controlled 04/12/2020   Other allergic rhinitis 04/12/2020   Migraine syndrome 04/12/2020   Cervical radicular pain 07/14/2013   Degenerative disc disease, lumbar 01/13/2013   Lumbosacral radiculopathy 01/13/2013   Pain syndrome, chronic 01/13/2013    Current Outpatient Medications on File Prior to Visit  Medication Sig Dispense Refill   albuterol (PROAIR HFA) 108 (90 Base) MCG/ACT inhaler INHALE 2 PUFFS BY MOUTH EVERY 4-6 HOURS AS NEEDED FOR COUGH OR WHEEZE 18 g 1   ARIPiprazole (ABILIFY) 5 MG tablet Take 1 tablet (5 mg total) by mouth daily. 90 tablet 3   budesonide-formoterol (SYMBICORT) 160-4.5 MCG/ACT inhaler INHALE 2 PUFFS TWICE DAILY TO PREVENT COUGH OR WHEEZE. RINSE, GARGLE AND SPIT AFTER USE 10.2 g 1   Calcium Citrate-Vitamin D (CALCIUM + D PO) Take by mouth.     cyproheptadine (PERIACTIN) 4 MG tablet TAKE 1 AND 1/2 TABLETS BY MOUTH EVERY DAY AT BEDTIME 135 tablet 3   DULoxetine (CYMBALTA) 30 MG capsule Take 1 capsule (30 mg total) by mouth daily. 90 capsule 3   DULoxetine (CYMBALTA) 60 MG capsule Take 1 capsule (60 mg total) by mouth every morning. 90 capsule 3   hydroxychloroquine (PLAQUENIL) 200 MG tablet Take 400 mg  by mouth daily.     ketoconazole (NIZORAL) 2 % shampoo USE 2 TO 3 TIMES PER WEEK UTD  3   latanoprost (XALATAN) 0.005 % ophthalmic solution INT 1 GTT IN OU Q NIGHT UTD  11   mometasone (NASONEX) 50 MCG/ACT nasal spray Use 1-2 sprays in each nostril once daily as needed 51 g 1   montelukast (SINGULAIR) 10 MG tablet Take 1 tablet by mouth once daily 90 tablet 1   Multiple Vitamins-Minerals (MULTIVITAMIN ADULT PO) Take by mouth.     predniSONE (DELTASONE) 1 MG tablet Take 3 mg by mouth daily. Taking 3 mg po qd for one week, and then alternating with 2 mg po qd     rosuvastatin (CRESTOR) 20 MG tablet      SYMBICORT 160-4.5 MCG/ACT inhaler Inhale 2 puffs into the lungs once daily 1 Inhaler 5   traZODone (DESYREL) 50 MG tablet Take 1 tablet (50 mg total) by mouth at bedtime. 90 tablet 3   No current facility-administered medications on file prior to visit.    Allergies  Allergen Reactions   Codeine Nausea And Vomiting    Objective:  General: Alert and oriented x3 in no acute distress  Dermatology: No open lesions bilateral lower extremities, no webspace macerations, no ecchymosis bilateral, all nails x 10 are well manicured.  Vascular: Dorsalis Pedis and Posterior Tibial pedal pulses palpable, Capillary Fill Time 3 seconds,(+) pedal hair growth bilateral, no edema bilateral lower  extremities, Temperature gradient within normal limits.  Neurology: Gross sensation intact via light touch bilateral, Vibratory sensation intact bilateral.  Negative Tinel's sign.  Positive Mulder sign like before.  Musculoskeletal: Mild tenderness with palpation at ball of left foot, second third and fourth interspaces on the left with worse pain at third interspace on left like before at the lateral side of the third toe and third metatarsophalangeal joint.  Mild hammertoe.    Assessment and Plan: Problem List Items Addressed This Visit    None    Visit Diagnoses    Neuroma    -  Primary    Neuritis       Metatarsalgia of left foot       Left foot pain           -Complete examination performed -Re-Discussed treatement options for neuroma at third webspace left foot -After oral consent and aseptic prep, injected a mixture containing 1 ml of 2%  plain lidocaine, 1 ml 0.5% plain marcaine, 1 ml of dexamethasone phosphate into left 3rd webspace without complication. Post-injection care discussed with patient.  This is injection #2 to the area. -Recommend icing tonight post injection for 20 mins like previous to prevent against bruising -Continue with Short cam boot when she is doing a lot of walking and standing -Advised patient in her home keep with offloading padding if she can not wear her cam boot in her home to continue to further offload her left foot -Return in 2 weeks for injection series on left for neuroma. Landis Martins, DPM

## 2020-09-22 ENCOUNTER — Telehealth: Payer: Self-pay | Admitting: Psychiatry

## 2020-09-22 NOTE — Telephone Encounter (Signed)
Error

## 2020-09-23 ENCOUNTER — Other Ambulatory Visit: Payer: Self-pay

## 2020-09-23 ENCOUNTER — Encounter: Payer: Self-pay | Admitting: Sports Medicine

## 2020-09-23 ENCOUNTER — Ambulatory Visit (INDEPENDENT_AMBULATORY_CARE_PROVIDER_SITE_OTHER): Payer: 59 | Admitting: Sports Medicine

## 2020-09-23 DIAGNOSIS — M7742 Metatarsalgia, left foot: Secondary | ICD-10-CM | POA: Diagnosis not present

## 2020-09-23 DIAGNOSIS — M79672 Pain in left foot: Secondary | ICD-10-CM

## 2020-09-23 DIAGNOSIS — M792 Neuralgia and neuritis, unspecified: Secondary | ICD-10-CM

## 2020-09-23 DIAGNOSIS — D3610 Benign neoplasm of peripheral nerves and autonomic nervous system, unspecified: Secondary | ICD-10-CM

## 2020-09-23 MED ORDER — DEXAMETHASONE SODIUM PHOSPHATE 120 MG/30ML IJ SOLN
4.0000 mg | Freq: Once | INTRAMUSCULAR | Status: AC
  Administered 2020-09-23: 4 mg via INTRA_ARTICULAR

## 2020-09-23 NOTE — Progress Notes (Signed)
Subjective: Susan Castillo is a 59 y.o. female patient who returns to office for follow-up evaluation of left foot pain. Patient reports that pain seems to doing great reports that she went to [place] and did a lot of walking and standing and even did a few outings without wearing her pad or cam boot and reports in her normal shoes she felt fine only time that she has a little bit of pain is with pressing the clutch in her car but otherwise she is doing really well.  Patient denies any other pedal complaints at this time.  Patient Active Problem List   Diagnosis Date Noted  . Osteoarthritis 04/28/2020  . Asthma, well controlled 04/12/2020  . Other allergic rhinitis 04/12/2020  . Migraine syndrome 04/12/2020  . Cervical radicular pain 07/14/2013  . Degenerative disc disease, lumbar 01/13/2013  . Lumbosacral radiculopathy 01/13/2013  . Pain syndrome, chronic 01/13/2013    Current Outpatient Medications on File Prior to Visit  Medication Sig Dispense Refill  . albuterol (PROAIR HFA) 108 (90 Base) MCG/ACT inhaler INHALE 2 PUFFS BY MOUTH EVERY 4-6 HOURS AS NEEDED FOR COUGH OR WHEEZE 18 g 1  . ARIPiprazole (ABILIFY) 5 MG tablet Take 1 tablet (5 mg total) by mouth daily. 90 tablet 3  . budesonide-formoterol (SYMBICORT) 160-4.5 MCG/ACT inhaler INHALE 2 PUFFS TWICE DAILY TO PREVENT COUGH OR WHEEZE. RINSE, GARGLE AND SPIT AFTER USE 10.2 g 1  . Calcium Citrate-Vitamin D (CALCIUM + D PO) Take by mouth.    . cyproheptadine (PERIACTIN) 4 MG tablet TAKE 1 AND 1/2 TABLETS BY MOUTH EVERY DAY AT BEDTIME 135 tablet 3  . DULoxetine (CYMBALTA) 30 MG capsule Take 1 capsule (30 mg total) by mouth daily. 90 capsule 3  . DULoxetine (CYMBALTA) 60 MG capsule Take 1 capsule (60 mg total) by mouth every morning. 90 capsule 3  . hydroxychloroquine (PLAQUENIL) 200 MG tablet Take 400 mg by mouth daily.    Marland Kitchen ketoconazole (NIZORAL) 2 % shampoo USE 2 TO 3 TIMES PER WEEK UTD  3  . latanoprost (XALATAN) 0.005 %  ophthalmic solution INT 1 GTT IN OU Q NIGHT UTD  11  . mometasone (NASONEX) 50 MCG/ACT nasal spray Use 1-2 sprays in each nostril once daily as needed 51 g 1  . montelukast (SINGULAIR) 10 MG tablet Take 1 tablet by mouth once daily 90 tablet 1  . Multiple Vitamins-Minerals (MULTIVITAMIN ADULT PO) Take by mouth.    . predniSONE (DELTASONE) 1 MG tablet Take 3 mg by mouth daily. Taking 3 mg po qd for one week, and then alternating with 2 mg po qd    . rosuvastatin (CRESTOR) 20 MG tablet     . SYMBICORT 160-4.5 MCG/ACT inhaler Inhale 2 puffs into the lungs once daily 1 Inhaler 5  . traZODone (DESYREL) 50 MG tablet Take 1 tablet (50 mg total) by mouth at bedtime. 90 tablet 3   No current facility-administered medications on file prior to visit.    Allergies  Allergen Reactions  . Codeine Nausea And Vomiting    Objective:  General: Alert and oriented x3 in no acute distress  Dermatology: No open lesions bilateral lower extremities, no webspace macerations, no ecchymosis bilateral, all nails x 10 are well manicured.  Vascular: Dorsalis Pedis and Posterior Tibial pedal pulses palpable, Capillary Fill Time 3 seconds,(+) pedal hair growth bilateral, no edema bilateral lower extremities, Temperature gradient within normal limits.  Neurology: Gross sensation intact via light touch bilateral, Vibratory sensation intact bilateral.  Negative Tinel's  sign.  Positive Mulder sign like before.  Musculoskeletal: Mild tenderness with palpation at ball of left foot, second third and fourth interspaces on the left with worse pain at third interspace on left like before today more centrally adjacent to the third toe and third metatarsophalangeal joint.  Mild hammertoe.    Assessment and Plan: Problem List Items Addressed This Visit    None    Visit Diagnoses    Neuroma    -  Primary   Relevant Medications   dexamethasone (DECADRON) injection 4 mg (Completed) (Start on 09/23/2020 12:15 PM)   Neuritis        Metatarsalgia of left foot       Left foot pain          -Complete examination performed -Re-Discussed treatement options for neuroma at third webspace left foot -After oral consent and aseptic prep, injected a mixture containing 1 ml of 2%  plain lidocaine, 1 ml 0.5% plain marcaine, 1 ml of dexamethasone phosphate into left 3rd webspace without complication. Post-injection care discussed with patient.  This is injection # 3 to the area. -Recommend icing tonight post injection for 20 mins like previous to prevent against bruising -Advised patient that she may slowly increase activities to tolerance and if there is any increase of pain to return to using her cam boot or a metatarsal offloading padding -Return in 2 weeks for injection series on left for neuroma. Landis Martins, DPM

## 2020-10-07 ENCOUNTER — Other Ambulatory Visit: Payer: Self-pay

## 2020-10-07 ENCOUNTER — Encounter: Payer: Self-pay | Admitting: Sports Medicine

## 2020-10-07 ENCOUNTER — Ambulatory Visit (INDEPENDENT_AMBULATORY_CARE_PROVIDER_SITE_OTHER): Payer: 59 | Admitting: Sports Medicine

## 2020-10-07 DIAGNOSIS — M7742 Metatarsalgia, left foot: Secondary | ICD-10-CM

## 2020-10-07 DIAGNOSIS — M79672 Pain in left foot: Secondary | ICD-10-CM | POA: Diagnosis not present

## 2020-10-07 DIAGNOSIS — M792 Neuralgia and neuritis, unspecified: Secondary | ICD-10-CM | POA: Diagnosis not present

## 2020-10-07 DIAGNOSIS — D3610 Benign neoplasm of peripheral nerves and autonomic nervous system, unspecified: Secondary | ICD-10-CM

## 2020-10-07 NOTE — Progress Notes (Signed)
Subjective: Susan Castillo is a 59 y.o. female patient who returns to office for follow-up evaluation of left foot pain. Patient reports that she is doing great with no pain much better she is walking without the boot not using sleep any more on the left foot since there is no pain.  Patient reports that she is able to drive her car that has a clutch in it without any pain Patient denies any other pedal complaints at this time.  Patient Active Problem List   Diagnosis Date Noted  . Osteoarthritis 04/28/2020  . Asthma, well controlled 04/12/2020  . Other allergic rhinitis 04/12/2020  . Migraine syndrome 04/12/2020  . Cervical radicular pain 07/14/2013  . Degenerative disc disease, lumbar 01/13/2013  . Lumbosacral radiculopathy 01/13/2013  . Pain syndrome, chronic 01/13/2013    Current Outpatient Medications on File Prior to Visit  Medication Sig Dispense Refill  . albuterol (PROAIR HFA) 108 (90 Base) MCG/ACT inhaler INHALE 2 PUFFS BY MOUTH EVERY 4-6 HOURS AS NEEDED FOR COUGH OR WHEEZE 18 g 1  . ARIPiprazole (ABILIFY) 5 MG tablet Take 1 tablet (5 mg total) by mouth daily. 90 tablet 3  . budesonide-formoterol (SYMBICORT) 160-4.5 MCG/ACT inhaler INHALE 2 PUFFS TWICE DAILY TO PREVENT COUGH OR WHEEZE. RINSE, GARGLE AND SPIT AFTER USE 10.2 g 1  . Calcium Citrate-Vitamin D (CALCIUM + D PO) Take by mouth.    . cyproheptadine (PERIACTIN) 4 MG tablet TAKE 1 AND 1/2 TABLETS BY MOUTH EVERY DAY AT BEDTIME 135 tablet 3  . DULoxetine (CYMBALTA) 30 MG capsule Take 1 capsule (30 mg total) by mouth daily. 90 capsule 3  . DULoxetine (CYMBALTA) 60 MG capsule Take 1 capsule (60 mg total) by mouth every morning. 90 capsule 3  . hydroxychloroquine (PLAQUENIL) 200 MG tablet Take 400 mg by mouth daily.    Marland Kitchen ketoconazole (NIZORAL) 2 % shampoo USE 2 TO 3 TIMES PER WEEK UTD  3  . latanoprost (XALATAN) 0.005 % ophthalmic solution INT 1 GTT IN OU Q NIGHT UTD  11  . mometasone (NASONEX) 50 MCG/ACT nasal spray Use 1-2  sprays in each nostril once daily as needed 51 g 1  . montelukast (SINGULAIR) 10 MG tablet Take 1 tablet by mouth once daily 90 tablet 1  . Multiple Vitamins-Minerals (MULTIVITAMIN ADULT PO) Take by mouth.    . predniSONE (DELTASONE) 1 MG tablet Take 3 mg by mouth daily. Taking 3 mg po qd for one week, and then alternating with 2 mg po qd    . rosuvastatin (CRESTOR) 20 MG tablet     . SYMBICORT 160-4.5 MCG/ACT inhaler Inhale 2 puffs into the lungs once daily 1 Inhaler 5  . traZODone (DESYREL) 50 MG tablet Take 1 tablet (50 mg total) by mouth at bedtime. 90 tablet 3   No current facility-administered medications on file prior to visit.    Allergies  Allergen Reactions  . Codeine Nausea And Vomiting    Objective:  General: Alert and oriented x3 in no acute distress  Dermatology: No open lesions bilateral lower extremities, no webspace macerations, no ecchymosis bilateral, all nails x 10 are well manicured.  Vascular: Dorsalis Pedis and Posterior Tibial pedal pulses palpable, Capillary Fill Time 3 seconds,(+) pedal hair growth bilateral, no edema bilateral lower extremities, Temperature gradient within normal limits.  Neurology: Gross sensation intact via light touch bilateral, Vibratory sensation intact bilateral.  Negative Tinel's sign.  Positive Mulder sign like before.  Musculoskeletal: No reproducible tenderness with palpation at ball of left  foot, second third and fourth interspaces, pain is prematurely resolved.  Mild hammertoe.    Assessment and Plan: Problem List Items Addressed This Visit    None    Visit Diagnoses    Neuroma    -  Primary   Neuritis       Metatarsalgia of left foot       Left foot pain          -Complete examination performed -Re-Discussed treatement options for neuroma at third webspace left foot -At this time since pain has prematurely resolved after 3 dexamethasone injections we will hold off on any additional injections to wait another 2 weeks.   -Advised patient that she may continue with good supportive shoes daily and activities to tolerance. -Return in 2 weeks for re-evaluation   Landis Martins, DPM

## 2020-10-11 ENCOUNTER — Ambulatory Visit: Payer: BC Managed Care – PPO | Admitting: Allergy

## 2020-10-21 ENCOUNTER — Encounter: Payer: Self-pay | Admitting: Sports Medicine

## 2020-10-21 ENCOUNTER — Other Ambulatory Visit: Payer: Self-pay

## 2020-10-21 ENCOUNTER — Ambulatory Visit (INDEPENDENT_AMBULATORY_CARE_PROVIDER_SITE_OTHER): Payer: 59 | Admitting: Sports Medicine

## 2020-10-21 DIAGNOSIS — M7742 Metatarsalgia, left foot: Secondary | ICD-10-CM | POA: Diagnosis not present

## 2020-10-21 DIAGNOSIS — D3610 Benign neoplasm of peripheral nerves and autonomic nervous system, unspecified: Secondary | ICD-10-CM | POA: Diagnosis not present

## 2020-10-21 DIAGNOSIS — M792 Neuralgia and neuritis, unspecified: Secondary | ICD-10-CM | POA: Diagnosis not present

## 2020-10-21 DIAGNOSIS — M79672 Pain in left foot: Secondary | ICD-10-CM | POA: Diagnosis not present

## 2020-10-21 NOTE — Progress Notes (Signed)
Subjective: Susan Castillo is a 59 y.o. female patient who returns to office for follow-up evaluation of left foot pain. Patient reports that she is doing great with no pain much better able to do normal activities without any limitation reports that she had a small bruise that popped up but no recurrence of pain.  Patient reports that it feels good to be able to do things without pain and states that she also got a left knee injection so her left lower extremity seems to be doing very well right now. Patient denies any other pedal complaints at this time.  Patient Active Problem List   Diagnosis Date Noted  . Osteoarthritis 04/28/2020  . Asthma, well controlled 04/12/2020  . Other allergic rhinitis 04/12/2020  . Migraine syndrome 04/12/2020  . Cervical radicular pain 07/14/2013  . Degenerative disc disease, lumbar 01/13/2013  . Lumbosacral radiculopathy 01/13/2013  . Pain syndrome, chronic 01/13/2013    Current Outpatient Medications on File Prior to Visit  Medication Sig Dispense Refill  . albuterol (PROAIR HFA) 108 (90 Base) MCG/ACT inhaler INHALE 2 PUFFS BY MOUTH EVERY 4-6 HOURS AS NEEDED FOR COUGH OR WHEEZE 18 g 1  . ARIPiprazole (ABILIFY) 5 MG tablet Take 1 tablet (5 mg total) by mouth daily. 90 tablet 3  . budesonide-formoterol (SYMBICORT) 160-4.5 MCG/ACT inhaler INHALE 2 PUFFS TWICE DAILY TO PREVENT COUGH OR WHEEZE. RINSE, GARGLE AND SPIT AFTER USE 10.2 g 1  . Calcium Citrate-Vitamin D (CALCIUM + D PO) Take by mouth.    . cyproheptadine (PERIACTIN) 4 MG tablet TAKE 1 AND 1/2 TABLETS BY MOUTH EVERY DAY AT BEDTIME 135 tablet 3  . DULoxetine (CYMBALTA) 30 MG capsule Take 1 capsule (30 mg total) by mouth daily. 90 capsule 3  . DULoxetine (CYMBALTA) 60 MG capsule Take 1 capsule (60 mg total) by mouth every morning. 90 capsule 3  . hydroxychloroquine (PLAQUENIL) 200 MG tablet Take 400 mg by mouth daily.    Marland Kitchen ketoconazole (NIZORAL) 2 % shampoo USE 2 TO 3 TIMES PER WEEK UTD  3  .  latanoprost (XALATAN) 0.005 % ophthalmic solution INT 1 GTT IN OU Q NIGHT UTD  11  . mometasone (NASONEX) 50 MCG/ACT nasal spray Use 1-2 sprays in each nostril once daily as needed 51 g 1  . montelukast (SINGULAIR) 10 MG tablet Take 1 tablet by mouth once daily 90 tablet 1  . Multiple Vitamins-Minerals (MULTIVITAMIN ADULT PO) Take by mouth.    . predniSONE (DELTASONE) 1 MG tablet Take 3 mg by mouth daily. Taking 3 mg po qd for one week, and then alternating with 2 mg po qd    . rosuvastatin (CRESTOR) 20 MG tablet     . SYMBICORT 160-4.5 MCG/ACT inhaler Inhale 2 puffs into the lungs once daily 1 Inhaler 5  . traZODone (DESYREL) 50 MG tablet Take 1 tablet (50 mg total) by mouth at bedtime. 90 tablet 3   No current facility-administered medications on file prior to visit.    Allergies  Allergen Reactions  . Codeine Nausea And Vomiting    Objective:  General: Alert and oriented x3 in no acute distress  Dermatology: No open lesions bilateral lower extremities, no webspace macerations, no ecchymosis bilateral, all nails x 10 are well manicured.  Vascular: Dorsalis Pedis and Posterior Tibial pedal pulses palpable, Capillary Fill Time 3 seconds,(+) pedal hair growth bilateral, no edema bilateral lower extremities, Temperature gradient within normal limits.  Neurology: Gross sensation intact via light touch bilateral, Vibratory sensation intact bilateral.  Negative Tinel's sign.  Positive Mulder sign like before.  Musculoskeletal: No reproducible tenderness with palpation at ball of left foot, second third and fourth interspaces, pain still remains resolved.  Mild hammertoe.    Assessment and Plan: Problem List Items Addressed This Visit    None    Visit Diagnoses    Neuroma    -  Primary   Neuritis       Metatarsalgia of left foot       Left foot pain          -Complete examination performed -Re-Discussed treatement options for resolved neuroma at third webspace left foot -At this  time since pain remains resolved after 3 dexamethasone injections we will hold off on any additional injections to wait another month for reevaluation and if she continues to do well we will have her to return as needed -Patient to continue with good supportive shoes and activities to tolerance -Return in 4 weeks for re-evaluation or sooner if problems or issues arise.  Landis Martins, DPM

## 2020-10-25 ENCOUNTER — Ambulatory Visit: Payer: 59 | Admitting: Allergy

## 2020-11-01 ENCOUNTER — Ambulatory Visit: Payer: 59 | Admitting: Allergy

## 2020-11-08 ENCOUNTER — Encounter: Payer: Self-pay | Admitting: Allergy

## 2020-11-08 ENCOUNTER — Other Ambulatory Visit: Payer: Self-pay

## 2020-11-08 ENCOUNTER — Ambulatory Visit (INDEPENDENT_AMBULATORY_CARE_PROVIDER_SITE_OTHER): Payer: 59 | Admitting: Allergy

## 2020-11-08 VITALS — BP 122/70 | HR 80 | Resp 16

## 2020-11-08 DIAGNOSIS — J4540 Moderate persistent asthma, uncomplicated: Secondary | ICD-10-CM | POA: Diagnosis not present

## 2020-11-08 DIAGNOSIS — J3089 Other allergic rhinitis: Secondary | ICD-10-CM | POA: Diagnosis not present

## 2020-11-08 DIAGNOSIS — G43909 Migraine, unspecified, not intractable, without status migrainosus: Secondary | ICD-10-CM | POA: Diagnosis not present

## 2020-11-08 MED ORDER — MOMETASONE FUROATE 50 MCG/ACT NA SUSP: NASAL | 1 refills | Status: DC

## 2020-11-08 MED ORDER — BUDESONIDE-FORMOTEROL FUMARATE 160-4.5 MCG/ACT IN AERO: INHALATION_SPRAY | RESPIRATORY_TRACT | 1 refills | Status: DC

## 2020-11-08 MED ORDER — CYPROHEPTADINE HCL 4 MG PO TABS: ORAL_TABLET | ORAL | 1 refills | Status: DC

## 2020-11-08 MED ORDER — MONTELUKAST SODIUM 10 MG PO TABS: ORAL_TABLET | ORAL | 1 refills | Status: DC

## 2020-11-08 NOTE — Patient Instructions (Signed)
Allergic Rhinitis Continue Nasonex 1-2 sprays each nostril once a day as needed for stuffy nose. Continue montelukast 10 mg one tablet once a day for allergy symptoms May use an over the counter antihistamine such as Zyrtec, Allegra, Xyzal or Claritin once a day as needed for runny nose or itching.  Asthma Since she has had good control we will have her try to decrease to Symbicort 160- take 1 puff once a day. Rinse mouth out after to help prevent thrush.  If she notes any increase in symptoms pain will go back to 2 puffs daily. Use albuterol 2 puffs every 4-6 hours as needed for coughing, wheezing, tightness in chest or shortness of breath. Also may use albuterol 2 puffs 5-15 minutes prior to exercise. Asthma control goals:   Full participation in all desired activities (may need albuterol before activity)  Albuterol use two time or less a week on average (not counting use with activity)  Cough interfering with sleep two time or less a month  Oral steroids no more than once a year  No hospitalizations  Migraine Syndrome Continue Periactin 6 mg ( 1-1/2 tablets) at bedtime.  Continue all other medications. Please let us know if this treatment plan is not working well for you.  Schedule follow up in 6 months

## 2020-11-08 NOTE — Progress Notes (Signed)
Follow-up Note  RE: Susan Castillo MRN: [id number] DOB: Sep 20, 1961 Date of Office Visit: 11/08/2020   History of present illness: Susan Castillo is a 59 y.o. female presenting today for follow-up of asthma, allergic rhinitis and migraine syndrome.  She was last seen in the office on 04/12/2020 by myself.  She states she has been doing well since this visit without any major health changes, surgeries or hospitalizations.  She needs refills today.  She has had her flu vaccine for this season. She states with her asthma that she has been doing well without any persistent symptoms.  She states she has needed to use her albuterol maybe once since her last visit.  She continues to take Symbicort 160 mcg 2 puffs once a day.  We did try in the past to reduce her down to Symbicort 80 however she had increase in symptoms and we had to go back up.  She has not had any ED or urgent care visits or any systemic steroid needs. She states she has had more watery eyes and nasal drainage.  She does use her Nasonex and Singulair daily.  She states the symptoms however were not enough to warrant increased use of her medications.  She has not required use of any antihistamines outside of her migraine medication. She does continue to take Periactin for her migraines with good response.  She takes 6 mg daily.  Review of systems: Review of Systems  Constitutional: Negative.   HENT:       See HPI  Eyes:       See HPI  Respiratory: Negative.   Cardiovascular: Negative.   Gastrointestinal: Negative.   Musculoskeletal: Negative.   Skin: Negative.   Neurological: Negative.     All other systems negative unless noted above in HPI  Past medical/social/surgical/family history have been reviewed and are unchanged unless specifically indicated below.  No changes  Medication List: Current Outpatient Medications  Medication Sig Dispense Refill  . albuterol (PROAIR HFA) 108 (90 Base) MCG/ACT inhaler INHALE 2  PUFFS BY MOUTH EVERY 4-6 HOURS AS NEEDED FOR COUGH OR WHEEZE 18 g 1  . ARIPiprazole (ABILIFY) 5 MG tablet Take 1 tablet (5 mg total) by mouth daily. 90 tablet 3  . budesonide-formoterol (SYMBICORT) 160-4.5 MCG/ACT inhaler Inhale 2 puffs into the lungs once daily 30.6 g 1  . Calcium Citrate-Vitamin D (CALCIUM + D PO) Take by mouth.    . cyproheptadine (PERIACTIN) 4 MG tablet TAKE 1 AND 1/2 TABLETS BY MOUTH EVERY DAY AT BEDTIME 135 tablet 1  . hydroxychloroquine (PLAQUENIL) 200 MG tablet Take 400 mg by mouth daily.    Marland Kitchen ketoconazole (NIZORAL) 2 % shampoo USE 2 TO 3 TIMES PER WEEK UTD  3  . latanoprost (XALATAN) 0.005 % ophthalmic solution INT 1 GTT IN OU Q NIGHT UTD  11  . mometasone (NASONEX) 50 MCG/ACT nasal spray Use 1-2 sprays in each nostril once daily as needed 51 g 1  . montelukast (SINGULAIR) 10 MG tablet Take 1 tablet by mouth once daily 90 tablet 1  . Multiple Vitamins-Minerals (MULTIVITAMIN ADULT PO) Take by mouth.    . predniSONE (DELTASONE) 1 MG tablet Take 3 mg by mouth daily. Taking 3 mg po qd for one week, and then alternating with 2 mg po qd    . rosuvastatin (CRESTOR) 20 MG tablet     . traZODone (DESYREL) 50 MG tablet Take 1 tablet (50 mg total) by mouth at bedtime. 90 tablet 3  .  DULoxetine (CYMBALTA) 30 MG capsule Take 1 capsule (30 mg total) by mouth daily. 90 capsule 3  . DULoxetine (CYMBALTA) 60 MG capsule Take 1 capsule (60 mg total) by mouth every morning. 90 capsule 3   No current facility-administered medications for this visit.     Known medication allergies: Allergies  Allergen Reactions  . Codeine Nausea And Vomiting     Physical examination: Blood pressure 122/70, pulse 80, resp. rate 16, SpO2 96 %.  General: Alert, interactive, in no acute distress. HEENT: PERRLA, TMs pearly gray, turbinates non-edematous without discharge, post-pharynx non erythematous. Neck: Supple without lymphadenopathy. Lungs: Clear to auscultation without wheezing, rhonchi or  rales. {no increased work of breathing. CV: Normal S1, S2 without murmurs. Abdomen: Nondistended, nontender. Skin: Warm and dry, without lesions or rashes. Extremities:  No clubbing, cyanosis or edema. Neuro:   Grossly intact.  Diagnositics/Labs:  Spirometry: FEV1: 2.51 L 93%, FVC: 2.8 L 80%, ratio consistent with Nonobstructive pattern  Assessment and plan:   Allergic Rhinitis Continue Nasonex 1-2 sprays each nostril once a day as needed for stuffy nose. Continue montelukast 10 mg one tablet once a day for allergy symptoms May use an over the counter antihistamine such as Zyrtec, Allegra, Xyzal or Claritin once a day as needed for runny nose or itching.  Asthma, moderate persistent Since she has had good control we will have her try to decrease to Symbicort 160- take 1 puff once a day. Rinse mouth out after to help prevent thrush.  If she notes any increase in symptoms pain will go back to 2 puffs daily. Use albuterol 2 puffs every 4-6 hours as needed for coughing, wheezing, tightness in chest or shortness of breath. Also may use albuterol 2 puffs 5-15 minutes prior to exercise. Asthma control goals:   Full participation in all desired activities (may need albuterol before activity)  Albuterol use two time or less a week on average (not counting use with activity)  Cough interfering with sleep two time or less a month  Oral steroids no more than once a year  No hospitalizations  Migraine Syndrome Continue Periactin 6 mg ( 1-1/2 tablets) at bedtime.  Continue all other medications. Please let us know if this treatment plan is not working well for you.  Schedule follow up in 6 months  I appreciate the opportunity to take part in [name]'s care. Please do not hesitate to contact me with questions.  Sincerely,   Prudy Feeler, MD Allergy/Immunology [facility]

## 2020-11-30 ENCOUNTER — Ambulatory Visit: Payer: 59 | Admitting: Sports Medicine

## 2020-12-26 ENCOUNTER — Other Ambulatory Visit: Payer: Self-pay | Admitting: Allergy

## 2021-01-22 ENCOUNTER — Other Ambulatory Visit: Payer: Self-pay | Admitting: Allergy and Immunology

## 2021-01-23 MED ORDER — CYPROHEPTADINE HCL 4 MG PO TABS: ORAL_TABLET | ORAL | 1 refills | Status: DC

## 2021-01-23 NOTE — Addendum Note (Signed)
Addended by: Valere Dross on: 01/23/2021 11:34 AM   Modules accepted: Orders

## 2021-02-01 DIAGNOSIS — M0579 Rheumatoid arthritis with rheumatoid factor of multiple sites without organ or systems involvement: Secondary | ICD-10-CM | POA: Diagnosis not present

## 2021-02-20 ENCOUNTER — Ambulatory Visit: Payer: BC Managed Care – PPO | Admitting: Psychiatry

## 2021-02-26 ENCOUNTER — Other Ambulatory Visit: Payer: Self-pay | Admitting: Psychiatry

## 2021-02-26 DIAGNOSIS — F3342 Major depressive disorder, recurrent, in full remission: Secondary | ICD-10-CM

## 2021-02-28 ENCOUNTER — Other Ambulatory Visit: Payer: Self-pay | Admitting: *Deleted

## 2021-02-28 MED ORDER — BUDESONIDE-FORMOTEROL FUMARATE 160-4.5 MCG/ACT IN AERO: INHALATION_SPRAY | RESPIRATORY_TRACT | 0 refills | Status: DC

## 2021-03-24 ENCOUNTER — Ambulatory Visit (INDEPENDENT_AMBULATORY_CARE_PROVIDER_SITE_OTHER): Payer: BC Managed Care – PPO | Admitting: Psychiatry

## 2021-03-24 ENCOUNTER — Encounter: Payer: Self-pay | Admitting: Psychiatry

## 2021-03-24 ENCOUNTER — Other Ambulatory Visit: Payer: Self-pay

## 2021-03-24 DIAGNOSIS — F3342 Major depressive disorder, recurrent, in full remission: Secondary | ICD-10-CM

## 2021-03-24 DIAGNOSIS — F5101 Primary insomnia: Secondary | ICD-10-CM | POA: Diagnosis not present

## 2021-03-24 DIAGNOSIS — F419 Anxiety disorder, unspecified: Secondary | ICD-10-CM | POA: Diagnosis not present

## 2021-03-24 MED ORDER — DULOXETINE HCL 30 MG PO CPEP
ORAL_CAPSULE | ORAL | 3 refills | Status: DC
Start: 2021-03-24 — End: 2022-01-10

## 2021-03-24 MED ORDER — DULOXETINE HCL 60 MG PO CPEP: 60.0000 mg | ORAL_CAPSULE | Freq: Every morning | ORAL | 3 refills | Status: DC

## 2021-03-24 MED ORDER — TRAZODONE HCL 50 MG PO TABS: 50.0000 mg | ORAL_TABLET | Freq: Every day | ORAL | 3 refills | Status: DC

## 2021-03-24 MED ORDER — LORAZEPAM 0.5 MG PO TABS: ORAL_TABLET | ORAL | 1 refills | Status: DC

## 2021-03-24 MED ORDER — ARIPIPRAZOLE 5 MG PO TABS: 5.0000 mg | ORAL_TABLET | Freq: Every day | ORAL | 3 refills | Status: DC

## 2021-03-24 NOTE — Progress Notes (Signed)
   03/24/21 1100  Facial and Oral Movements  Muscles of Facial Expression 0  Lips and Perioral Area 0  Jaw 0  Tongue 0  Extremity Movements  Upper (arms, wrists, hands, fingers) 0  Lower (legs, knees, ankles, toes) 0  Trunk Movements  Neck, shoulders, hips 0  Overall Severity  Severity of abnormal movements (highest score from questions above) 0  Incapacitation due to abnormal movements 0  Patient's awareness of abnormal movements (rate only patient's report) 0  AIMS Total Score  AIMS Total Score 0
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
## 2021-03-29 DIAGNOSIS — M0579 Rheumatoid arthritis with rheumatoid factor of multiple sites without organ or systems involvement: Secondary | ICD-10-CM | POA: Diagnosis not present

## 2021-04-03 ENCOUNTER — Other Ambulatory Visit: Payer: Self-pay | Admitting: *Deleted

## 2021-04-03 DIAGNOSIS — M8589 Other specified disorders of bone density and structure, multiple sites: Secondary | ICD-10-CM | POA: Diagnosis not present

## 2021-04-03 DIAGNOSIS — M25561 Pain in right knee: Secondary | ICD-10-CM | POA: Diagnosis not present

## 2021-04-03 DIAGNOSIS — M1990 Unspecified osteoarthritis, unspecified site: Secondary | ICD-10-CM | POA: Diagnosis not present

## 2021-04-03 DIAGNOSIS — M0579 Rheumatoid arthritis with rheumatoid factor of multiple sites without organ or systems involvement: Secondary | ICD-10-CM | POA: Diagnosis not present

## 2021-04-03 DIAGNOSIS — R748 Abnormal levels of other serum enzymes: Secondary | ICD-10-CM | POA: Diagnosis not present

## 2021-04-03 DIAGNOSIS — Z79899 Other long term (current) drug therapy: Secondary | ICD-10-CM | POA: Diagnosis not present

## 2021-04-03 MED ORDER — MOMETASONE FUROATE 50 MCG/ACT NA SUSP: NASAL | 0 refills | Status: DC

## 2021-04-11 ENCOUNTER — Ambulatory Visit (INDEPENDENT_AMBULATORY_CARE_PROVIDER_SITE_OTHER): Payer: BC Managed Care – PPO | Admitting: Allergy

## 2021-04-11 ENCOUNTER — Encounter: Payer: Self-pay | Admitting: Allergy

## 2021-04-11 ENCOUNTER — Other Ambulatory Visit: Payer: Self-pay

## 2021-04-11 VITALS — BP 138/74 | HR 80 | Resp 12 | Ht 64.4 in | Wt 219.4 lb

## 2021-04-11 DIAGNOSIS — H1013 Acute atopic conjunctivitis, bilateral: Secondary | ICD-10-CM | POA: Diagnosis not present

## 2021-04-11 DIAGNOSIS — G43909 Migraine, unspecified, not intractable, without status migrainosus: Secondary | ICD-10-CM

## 2021-04-11 DIAGNOSIS — J3089 Other allergic rhinitis: Secondary | ICD-10-CM | POA: Diagnosis not present

## 2021-04-11 DIAGNOSIS — J4540 Moderate persistent asthma, uncomplicated: Secondary | ICD-10-CM

## 2021-04-11 NOTE — Progress Notes (Signed)
Follow-up Note  RE: KENDAHL BUMGARDNER MRN: [id number] DOB: [birth year] Date of Office Visit: 04/11/2021   History of present illness: Susan Castillo is a 60 y.o. female presenting today for follow-up of asthma, allergic rhinitis and migraine syndrome.  She was last seen in the office on 11/08/2020 by myself.  She states she has done relatively well since his last visit without any major health changes, surgeries or hospitalizations.  She states with the onset of pollen season she has been having some itchy eyes but states it is mild and at this point does not want any allergy drops for this.  She does continue to take montelukast daily.  She also will use nasal steroid spray Nasonex for nasal congestion control. With her asthma she states this has been doing well.  She is using Symbicort 160 mcg taking 1 puff once a day.  She has not really needed to use her albuterol inhaler.  She states there may have been a day or so which she did take an additional puff of the Symbicort due to increase in symptoms but this is not common.  She denies nighttime awakenings.  She has not required any systemic steroid needs or ED or urgent care visits. She continues to take Periactin 6 mg daily for migraine control which still is working for her.    Review of systems: Review of Systems  Constitutional: Negative.   HENT: Negative.   Eyes:       See HPI  Respiratory: Negative.   Cardiovascular: Negative.   Gastrointestinal: Negative.   Musculoskeletal: Negative.   Skin: Negative.   Neurological: Negative.     All other systems negative unless noted above in HPI  Past medical/social/surgical/family history have been reviewed and are unchanged unless specifically indicated below.  No changes  Medication List: Current Outpatient Medications  Medication Sig Dispense Refill  . albuterol (PROAIR HFA) 108 (90 Base) MCG/ACT inhaler INHALE 2 PUFFS BY MOUTH EVERY 4-6 HOURS AS NEEDED FOR COUGH OR WHEEZE 18  g 1  . ARIPiprazole (ABILIFY) 5 MG tablet Take 1 tablet (5 mg total) by mouth daily. 90 tablet 3  . budesonide-formoterol (SYMBICORT) 160-4.5 MCG/ACT inhaler Inhale 2 puffs into the lungs once daily 30.6 g 0  . Calcium Citrate-Vitamin D (CALCIUM + D PO) Take by mouth.    . cyproheptadine (PERIACTIN) 4 MG tablet TAKE 1 AND 1/2 TABLETS BY MOUTH EVERY DAY AT BEDTIME 135 tablet 1  . DULoxetine (CYMBALTA) 30 MG capsule TAKE 1 CAPSULE BY MOUTH EVERY DAY 90 capsule 3  . DULoxetine (CYMBALTA) 60 MG capsule Take 1 capsule (60 mg total) by mouth every morning. 90 capsule 3  . hydroxychloroquine (PLAQUENIL) 200 MG tablet Take 400 mg by mouth daily.    Marland Kitchen ketoconazole (NIZORAL) 2 % shampoo USE 2 TO 3 TIMES PER WEEK UTD  3  . latanoprost (XALATAN) 0.005 % ophthalmic solution INT 1 GTT IN OU Q NIGHT UTD  11  . LORazepam (ATIVAN) 0.5 MG tablet Take 1/2-2 tablets every 6 hours as needed for anxiety and panic 90 tablet 1  . mometasone (NASONEX) 50 MCG/ACT nasal spray Use 1-2 sprays in each nostril once daily as needed 51 g 0  . montelukast (SINGULAIR) 10 MG tablet TAKE 1 TABLET BY MOUTH EVERY DAY 90 tablet 1  . Multiple Vitamins-Minerals (MULTIVITAMIN ADULT PO) Take by mouth.    . predniSONE (DELTASONE) 1 MG tablet Take 3 mg by mouth daily.    . rosuvastatin (CRESTOR)  20 MG tablet     . traZODone (DESYREL) 50 MG tablet Take 1 tablet (50 mg total) by mouth at bedtime. 90 tablet 3   No current facility-administered medications for this visit.     Known medication allergies: Allergies  Allergen Reactions  . Codeine Nausea And Vomiting     Physical examination: Blood pressure 138/74, pulse 80, resp. rate 12, height 5' 4.4" (1.636 m), weight 219 lb 6.4 oz (99.5 kg), SpO2 96 %.  General: Alert, interactive, in no acute distress. HEENT: PERRLA, TMs pearly gray, turbinates non-edematous without discharge, post-pharynx non erythematous. Neck: Supple without lymphadenopathy. Lungs: Clear to auscultation  without wheezing, rhonchi or rales. {no increased work of breathing. CV: Normal S1, S2 without murmurs. Abdomen: Nondistended, nontender. Skin: Warm and dry, without lesions or rashes. Extremities:  No clubbing, cyanosis or edema. Neuro:   Grossly intact.  Diagnositics/Labs:  Spirometry: FEV1: 2.71 L 103%, FVC: 3.06 L 91%, ratio consistent with Nonobstructive pattern  Assessment and plan:   Allergic Rhinitis with conjunctivitis Continue Nasonex 1-2 sprays each nostril once a day as needed for stuffy nose. Continue montelukast 10 mg one tablet once a day for allergy symptoms May use an over the counter antihistamine such as Zyrtec, Allegra, Xyzal or Claritin once a day as needed for runny nose or itching. Recommend can use over-the-counter Pataday 1 drop each eye daily as needed for itchy watery eyes  Asthma Continues to have good control Continue Symbicort 160- take 1 puff once a day. Rinse mouth out after to help prevent thrush.  If she notes any increase in symptoms pain will go back to 2 puffs daily. Use albuterol 2 puffs every 4-6 hours as needed for coughing, wheezing, tightness in chest or shortness of breath. Also may use albuterol 2 puffs 5-15 minutes prior to exercise. Asthma control goals:   Full participation in all desired activities (may need albuterol before activity)  Albuterol use two time or less a week on average (not counting use with activity)  Cough interfering with sleep two time or less a month  Oral steroids no more than once a year  No hospitalizations  Migraine Syndrome Continue Periactin 6 mg ( 1-1/2 tablets) at bedtime.  Continue all other medications. Please let us know if this treatment plan is not working well for you.  Schedule follow up in 6 months  I appreciate the opportunity to take part in Diana's care. Please do not hesitate to contact me with questions.  Sincerely,   Prudy Feeler, MD Allergy/Immunology [facility]

## 2021-04-12 NOTE — Patient Instructions (Signed)
Allergic Rhinitis with conjunctivitis Continue Nasonex 1-2 sprays each nostril once a day as needed for stuffy nose. Continue montelukast 10 mg one tablet once a day for allergy symptoms May use an over the counter antihistamine such as Zyrtec, Allegra, Xyzal or Claritin once a day as needed for runny nose or itching. Recommend can use over-the-counter Pataday 1 drop each eye daily as needed for itchy watery eyes  Asthma Continues to have good control Continue Symbicort 160- take 1 puff once a day. Rinse mouth out after to help prevent thrush.  If she notes any increase in symptoms pain will go back to 2 puffs daily. Use albuterol 2 puffs every 4-6 hours as needed for coughing, wheezing, tightness in chest or shortness of breath. Also may use albuterol 2 puffs 5-15 minutes prior to exercise. Asthma control goals:   Full participation in all desired activities (may need albuterol before activity)  Albuterol use two time or less a week on average (not counting use with activity)  Cough interfering with sleep two time or less a month  Oral steroids no more than once a year  No hospitalizations  Migraine Syndrome Continue Periactin 6 mg ( 1-1/2 tablets) at bedtime.  Continue all other medications. Please let us know if this treatment plan is not working well for you.  Schedule follow up in 6 months

## 2021-04-26 DIAGNOSIS — J3489 Other specified disorders of nose and nasal sinuses: Secondary | ICD-10-CM | POA: Diagnosis not present

## 2021-04-26 DIAGNOSIS — R059 Cough, unspecified: Secondary | ICD-10-CM | POA: Diagnosis not present

## 2021-04-26 DIAGNOSIS — Z20828 Contact with and (suspected) exposure to other viral communicable diseases: Secondary | ICD-10-CM | POA: Diagnosis not present

## 2021-05-01 DIAGNOSIS — R7303 Prediabetes: Secondary | ICD-10-CM | POA: Diagnosis not present

## 2021-05-01 DIAGNOSIS — Z0000 Encounter for general adult medical examination without abnormal findings: Secondary | ICD-10-CM | POA: Diagnosis not present

## 2021-05-08 DIAGNOSIS — Z0000 Encounter for general adult medical examination without abnormal findings: Secondary | ICD-10-CM | POA: Diagnosis not present

## 2021-05-08 DIAGNOSIS — K760 Fatty (change of) liver, not elsewhere classified: Secondary | ICD-10-CM | POA: Diagnosis not present

## 2021-05-08 DIAGNOSIS — E782 Mixed hyperlipidemia: Secondary | ICD-10-CM | POA: Diagnosis not present

## 2021-05-08 DIAGNOSIS — R7303 Prediabetes: Secondary | ICD-10-CM | POA: Diagnosis not present

## 2021-05-24 DIAGNOSIS — M0579 Rheumatoid arthritis with rheumatoid factor of multiple sites without organ or systems involvement: Secondary | ICD-10-CM | POA: Diagnosis not present

## 2021-06-08 DIAGNOSIS — L299 Pruritus, unspecified: Secondary | ICD-10-CM | POA: Diagnosis not present

## 2021-06-08 DIAGNOSIS — D485 Neoplasm of uncertain behavior of skin: Secondary | ICD-10-CM | POA: Diagnosis not present

## 2021-06-08 DIAGNOSIS — L300 Nummular dermatitis: Secondary | ICD-10-CM | POA: Diagnosis not present

## 2021-06-23 ENCOUNTER — Other Ambulatory Visit: Payer: Self-pay

## 2021-06-23 ENCOUNTER — Encounter: Payer: Self-pay | Admitting: Psychiatry

## 2021-06-23 ENCOUNTER — Ambulatory Visit (INDEPENDENT_AMBULATORY_CARE_PROVIDER_SITE_OTHER): Payer: BC Managed Care – PPO | Admitting: Psychiatry

## 2021-06-23 DIAGNOSIS — F5101 Primary insomnia: Secondary | ICD-10-CM

## 2021-06-23 DIAGNOSIS — F419 Anxiety disorder, unspecified: Secondary | ICD-10-CM | POA: Diagnosis not present

## 2021-06-23 DIAGNOSIS — F3342 Major depressive disorder, recurrent, in full remission: Secondary | ICD-10-CM

## 2021-06-23 NOTE — Progress Notes (Signed)
Susan Castillo [id number] 04/11/1961 60 y.o.  Subjective:   Patient ID:  Susan Castillo is a 61 y.o. (DOB 17-Apr-1961) female.  Chief Complaint:  Chief Complaint  Patient presents with   Anxiety     HPI [name] presents to the office today for follow-up of anxiety and h/o depression. She reports that anxiety medication was helpful during trip to [place].  She reports that anxiety is better when she is driving. She has some anxiety when she is a passenger. She recently rode as a passenger and did ok on city roads without curves. She learned that she has skin cancer on her chest and will have this removed July 13. She reports that she has some anxiety about this. She reports some occasional work stress. She reports that she had anxiety after she made a mistake at work that went to production and waiting to see how this would be handled. She reports that her work was respectful and addressed how to prevent this from occurring again. She reports that her mood has been good overall. She reports that she is "busy and productive." Works from home and does things around her house after work. Appetite has been good. She typically eats a late lunch and will eat dinner later. Concentration has been good. Denies SI.   She reports that she has reached out to [facility] several times and did not receive a response.   God-daughter has 3 children that she considers her grandchildren. Her son lives with her and he has been providing child care for them. Son lives with her and helps cover costs and take care of the home.  Mother-in-law has aggressive cancer.   Past Medication Trials: Cymbalta-effective Concerta Abilify Trazodone Rexulti Lorazepam  AIMS    Flowsheet Row Office Visit from 06/23/2021 in [place] Office Visit from 03/24/2021 in [place] Office Visit from 08/20/2019 in [place] Total Score 0 0 0         Review of Systems:  Review of Systems  Musculoskeletal:  Negative for gait problem.  Neurological:  Negative for tremors.  Psychiatric/Behavioral:         Please refer to HPI   Medications: I have reviewed the patient's current medications.  Current Outpatient Medications  Medication Sig Dispense Refill   albuterol (PROAIR HFA) 108 (90 Base) MCG/ACT inhaler INHALE 2 PUFFS BY MOUTH EVERY 4-6 HOURS AS NEEDED FOR COUGH OR WHEEZE 18 g 1   ARIPiprazole (ABILIFY) 5 MG tablet Take 1 tablet (5 mg total) by mouth daily. 90 tablet 3   budesonide-formoterol (SYMBICORT) 160-4.5 MCG/ACT inhaler Inhale 2 puffs into the lungs once daily 30.6 g 0   Calcium Citrate-Vitamin D (CALCIUM + D PO) Take by mouth.     cyproheptadine (PERIACTIN) 4 MG tablet TAKE 1 AND 1/2 TABLETS BY MOUTH EVERY DAY AT BEDTIME 135 tablet 1   DULoxetine (CYMBALTA) 30 MG capsule TAKE 1 CAPSULE BY MOUTH EVERY DAY 90 capsule 3   hydroxychloroquine (PLAQUENIL) 200 MG tablet Take 400 mg by mouth daily.     ketoconazole (NIZORAL) 2 % shampoo USE 2 TO 3 TIMES PER WEEK UTD  3   latanoprost (XALATAN) 0.005 % ophthalmic solution INT 1 GTT IN OU Q NIGHT UTD  11   mometasone (NASONEX) 50 MCG/ACT nasal spray Use 1-2 sprays in each nostril once daily as needed 51 g 0   montelukast (SINGULAIR) 10 MG tablet TAKE 1 TABLET BY MOUTH EVERY  DAY 90 tablet 1   Multiple Vitamins-Minerals (MULTIVITAMIN ADULT PO) Take by mouth.     predniSONE (DELTASONE) 1 MG tablet Take 3 mg by mouth daily.     rosuvastatin (CRESTOR) 20 MG tablet      traZODone (DESYREL) 50 MG tablet Take 1 tablet (50 mg total) by mouth at bedtime. 90 tablet 3   DULoxetine (CYMBALTA) 60 MG capsule Take 1 capsule (60 mg total) by mouth every morning. 90 capsule 3   LORazepam (ATIVAN) 0.5 MG tablet Take 1/2-2 tablets every 6 hours as needed for anxiety and panic 90 tablet 1   No current facility-administered medications for this visit.    Medication Side Effects:  None  Allergies:  Allergies  Allergen Reactions   Codeine Nausea And Vomiting    Past Medical History:  Diagnosis Date   Allergic rhinitis    Asthma    Glaucoma    Migraines    RA (rheumatoid arthritis) ([place])     Past Medical History, Surgical history, Social history, and Family history were reviewed and updated as appropriate.   Please see review of systems for further details on the patient's review from today.   Objective:   Physical Exam:  There were no vitals taken for this visit.  Physical Exam Constitutional:      General: She is not in acute distress. Musculoskeletal:        General: No deformity.  Neurological:     Mental Status: She is alert and oriented to person, place, and time.     Coordination: Coordination normal.  Psychiatric:        Attention and Perception: Attention and perception normal. She does not perceive auditory or visual hallucinations.        Mood and Affect: Mood normal. Mood is not anxious or depressed. Affect is not labile, blunt, angry or inappropriate.        Speech: Speech normal.        Behavior: Behavior normal.        Thought Content: Thought content normal. Thought content is not paranoid or delusional. Thought content does not include homicidal or suicidal ideation. Thought content does not include homicidal or suicidal plan.        Cognition and Memory: Cognition and memory normal.        Judgment: Judgment normal.     Comments: Insight intact    Lab Review:     Component Value Date/Time   NA 140 11/30/2009 1435   K 4.4 11/30/2009 1435   CL 107 11/30/2009 1435   CO2 26 11/30/2009 1435   GLUCOSE 100 (H) 11/30/2009 1435   BUN 11 11/30/2009 1435   CREATININE 0.77 11/30/2009 1435   CALCIUM 9.4 11/30/2009 1435   PROT 6.9 06/01/2009 0948   ALBUMIN 4.1 06/01/2009 0948   AST 36 06/01/2009 0948   ALT 21 06/01/2009 0948   ALKPHOS 81 06/01/2009 0948   BILITOT 0.6 06/01/2009 0948   GFRNONAA >60 11/30/2009 1435   GFRAA   11/30/2009 1435    >60        The eGFR has been calculated using the MDRD equation. This calculation has not been validated in all clinical situations. eGFR's persistently <60 mL/min signify possible Chronic Kidney Disease.       Component Value Date/Time   WBC 8.9 01/17/2018 0928   WBC 8.2 11/30/2009 1435   RBC 4.38 01/17/2018 0928   RBC 4.28 11/30/2009 1435   HGB 13.8 01/17/2018 0928   HCT 42.0 01/17/2018  0928   PLT 355 01/17/2018 0928   MCV 96 01/17/2018 0928   MCH 31.5 01/17/2018 0928   MCHC 32.9 01/17/2018 0928   MCHC 34.3 11/30/2009 1435   RDW 14.0 01/17/2018 0928   LYMPHSABS 2.0 01/17/2018 0928   MONOABS 1.0 06/09/2009 0441   EOSABS 0.1 01/17/2018 0928   BASOSABS 0.0 01/17/2018 0928    No results found for: POCLITH, LITHIUM   No results found for: PHENYTOIN, PHENOBARB, VALPROATE, CBMZ   .res Assessment: Plan:    Continue Abilify 5 mg daily for depression. Continue Duloxetine 90 mg daily for depression and anxiety.  Continue Ativan 0.5 mg 1/2-2 tabs po prn anxiety and panic.  Continue Trazodone 50 mg at bedtime for insomnia.  Pt to follow-up in 6 months or sooner if clincially indicated.  Referral made to Lina Sayre, [facility] for EMDR related to anxiety with driving/riding as a passenger.  Patient advised to contact office with any questions, adverse effects, or acute worsening in signs and symptoms.   Alvin was seen today for anxiety.  Diagnoses and all orders for this visit:  Major depression, recurrent, full remission ([place])  Primary insomnia  Anxiety disorder, unspecified type    Please see After Visit Summary for patient specific instructions.  Future Appointments  Date Time Provider [place]  10/17/2021  4:00 PM Kennith Gain, MD [facility] None  12/28/2021  1:00 PM Thayer Headings, PMHNP [facility] None    No orders of the defined types were placed in this encounter.   -------------------------------

## 2021-06-23 NOTE — Progress Notes (Signed)
   06/23/21 1308  Facial and Oral Movements  Muscles of Facial Expression 0  Lips and Perioral Area 0  Jaw 0  Tongue 0  Extremity Movements  Upper (arms, wrists, hands, fingers) 0  Lower (legs, knees, ankles, toes) 0  Trunk Movements  Neck, shoulders, hips 0  Overall Severity  Severity of abnormal movements (highest score from questions above) 0  Incapacitation due to abnormal movements 0  Patient's awareness of abnormal movements (rate only patient's report) 0  AIMS Total Score  AIMS Total Score 0

## 2021-06-26 ENCOUNTER — Other Ambulatory Visit: Payer: Self-pay | Admitting: Allergy

## 2021-06-28 ENCOUNTER — Other Ambulatory Visit: Payer: Self-pay | Admitting: Allergy

## 2021-07-12 DIAGNOSIS — C44529 Squamous cell carcinoma of skin of other part of trunk: Secondary | ICD-10-CM | POA: Diagnosis not present

## 2021-07-13 ENCOUNTER — Other Ambulatory Visit: Payer: Self-pay | Admitting: Allergy

## 2021-07-19 DIAGNOSIS — M0579 Rheumatoid arthritis with rheumatoid factor of multiple sites without organ or systems involvement: Secondary | ICD-10-CM | POA: Diagnosis not present

## 2021-07-24 ENCOUNTER — Other Ambulatory Visit: Payer: Self-pay | Admitting: Allergy

## 2021-09-13 DIAGNOSIS — M0579 Rheumatoid arthritis with rheumatoid factor of multiple sites without organ or systems involvement: Secondary | ICD-10-CM | POA: Diagnosis not present

## 2021-09-21 ENCOUNTER — Other Ambulatory Visit: Payer: Self-pay | Admitting: Allergy

## 2021-09-22 DIAGNOSIS — H903 Sensorineural hearing loss, bilateral: Secondary | ICD-10-CM | POA: Diagnosis not present

## 2021-09-25 DIAGNOSIS — Z23 Encounter for immunization: Secondary | ICD-10-CM | POA: Diagnosis not present

## 2021-09-25 DIAGNOSIS — M1990 Unspecified osteoarthritis, unspecified site: Secondary | ICD-10-CM | POA: Diagnosis not present

## 2021-09-25 DIAGNOSIS — M0579 Rheumatoid arthritis with rheumatoid factor of multiple sites without organ or systems involvement: Secondary | ICD-10-CM | POA: Diagnosis not present

## 2021-09-25 DIAGNOSIS — M25561 Pain in right knee: Secondary | ICD-10-CM | POA: Diagnosis not present

## 2021-09-25 DIAGNOSIS — R748 Abnormal levels of other serum enzymes: Secondary | ICD-10-CM | POA: Diagnosis not present

## 2021-09-25 DIAGNOSIS — Z79899 Other long term (current) drug therapy: Secondary | ICD-10-CM | POA: Diagnosis not present

## 2021-10-13 ENCOUNTER — Other Ambulatory Visit: Payer: Self-pay | Admitting: Allergy

## 2021-10-17 ENCOUNTER — Other Ambulatory Visit: Payer: Self-pay

## 2021-10-17 ENCOUNTER — Encounter: Payer: Self-pay | Admitting: Allergy

## 2021-10-17 ENCOUNTER — Ambulatory Visit (INDEPENDENT_AMBULATORY_CARE_PROVIDER_SITE_OTHER): Payer: BC Managed Care – PPO | Admitting: Allergy

## 2021-10-17 VITALS — BP 138/78 | HR 86 | Resp 18

## 2021-10-17 DIAGNOSIS — J3089 Other allergic rhinitis: Secondary | ICD-10-CM | POA: Diagnosis not present

## 2021-10-17 DIAGNOSIS — J4540 Moderate persistent asthma, uncomplicated: Secondary | ICD-10-CM | POA: Diagnosis not present

## 2021-10-17 DIAGNOSIS — G43909 Migraine, unspecified, not intractable, without status migrainosus: Secondary | ICD-10-CM

## 2021-10-17 DIAGNOSIS — H1013 Acute atopic conjunctivitis, bilateral: Secondary | ICD-10-CM

## 2021-10-17 MED ORDER — CYPROHEPTADINE HCL 4 MG PO TABS: ORAL_TABLET | ORAL | 2 refills | Status: DC

## 2021-10-17 MED ORDER — MONTELUKAST SODIUM 10 MG PO TABS: ORAL_TABLET | ORAL | 2 refills | Status: DC

## 2021-10-17 MED ORDER — MOMETASONE FUROATE 50 MCG/ACT NA SUSP: NASAL | 2 refills | Status: DC

## 2021-10-17 NOTE — Progress Notes (Signed)
Follow-up Note  RE: Susan Castillo MRN: [id number] DOB: 08/22/1961 Date of Office Visit: 10/17/2021   History of present illness: Susan Castillo is a 60 y.o. female presenting today for follow-up of allergic rhinitis with conjunctivitis, asthma and migraine syndrome.  She was last seen in the office on 04/11/2021 by myself.  She states she has done well without any major health changes, surgeries or hospitalizations since this visit.  She has used albuterol inhaler couple times with changes in the weather with onset of fall.  She has also used albuterol couple times at night for wheeze.  Otherwise denies any other use.  She denies any nighttime awakenings.  She has not required any ED or urgent care visits or any systemic steroid needs.  She does continue to take Symbicort 160 mcg 1 puff once a day.  She did get her flu shot 2 weeks ago.   She does note some watery eyes mostly in the morning but nothing she has needed to take anything for.  She did use nasanex couple times with change in weather for nasal drainage and states it did help with this.  She continues to take Periactin 6 mg at bedtime and this continues to control her migraines.  Review of systems in the past 4 weeks: Review of Systems  Constitutional: Negative.   HENT:         See HPI  Eyes:        See HPI  Respiratory:         See HPI  Cardiovascular: Negative.   Gastrointestinal: Negative.   Musculoskeletal: Negative.   Skin: Negative.   Neurological: Negative.    All other systems negative unless noted above in HPI  Past medical/social/surgical/family history have been reviewed and are unchanged unless specifically indicated below.  No changes  Medication List: Current Outpatient Medications  Medication Sig Dispense Refill   albuterol (PROAIR HFA) 108 (90 Base) MCG/ACT inhaler INHALE 2 PUFFS BY MOUTH EVERY 4-6 HOURS AS NEEDED FOR COUGH OR WHEEZE 18 g 1   ARIPiprazole (ABILIFY) 5 MG tablet Take 1 tablet  (5 mg total) by mouth daily. 90 tablet 3   budesonide-formoterol (SYMBICORT) 160-4.5 MCG/ACT inhaler INHALE 2 PUFFS BY MOUTH EVERY DAY 10.2 each 3   Calcium Citrate-Vitamin D (CALCIUM + D PO) Take by mouth.     cyproheptadine (PERIACTIN) 4 MG tablet TAKE 1 & 1/2 TABLETS BY MOUTH EVERY DAYS AT BEDTIME 135 tablet 1   DULoxetine (CYMBALTA) 30 MG capsule TAKE 1 CAPSULE BY MOUTH EVERY DAY 90 capsule 3   hydroxychloroquine (PLAQUENIL) 200 MG tablet Take 400 mg by mouth daily.     ketoconazole (NIZORAL) 2 % shampoo USE 2 TO 3 TIMES PER WEEK UTD  3   latanoprost (XALATAN) 0.005 % ophthalmic solution INT 1 GTT IN OU Q NIGHT UTD  11   LORazepam (ATIVAN) 0.5 MG tablet Take 1/2-2 tablets every 6 hours as needed for anxiety and panic 90 tablet 1   mometasone (NASONEX) 50 MCG/ACT nasal spray USE 1-2 SPRAYS IN EACH NOSTRIL ONCE DAILY AS NEEDED 1 each 1   montelukast (SINGULAIR) 10 MG tablet TAKE 1 TABLET BY MOUTH EVERY DAY 30 tablet 3   Multiple Vitamins-Minerals (MULTIVITAMIN ADULT PO) Take by mouth.     predniSONE (DELTASONE) 1 MG tablet Take 3 mg by mouth daily.     rosuvastatin (CRESTOR) 20 MG tablet      traZODone (DESYREL) 50 MG tablet Take 1 tablet (50 mg  total) by mouth at bedtime. 90 tablet 3   DULoxetine (CYMBALTA) 60 MG capsule Take 1 capsule (60 mg total) by mouth every morning. 90 capsule 3   No current facility-administered medications for this visit.     Known medication allergies: Allergies  Allergen Reactions   Codeine Nausea And Vomiting     Physical examination: Blood pressure 138/78, pulse 86, resp. rate 18, SpO2 98 %.  General: Alert, interactive, in no acute distress. HEENT:PERRLA, TMs pearly gray, turbinates minimally edematous without discharge, post-pharynx non erythematous. Neck: Supple without lymphadenopathy. Lungs: Clear to auscultation without wheezing, rhonchi or rales. {no increased work of breathing. CV: Normal S1, S2 without murmurs. Abdomen: Nondistended,  nontender. Skin: Warm and dry, without lesions or rashes. Extremities:  No clubbing, cyanosis or edema. Neuro:   Grossly intact.  Diagnositics/Labs: None today   Assessment and plan:   Allergic Rhinitis with conjunctivitis Continue Nasonex 1-2 sprays each nostril once a day as needed for stuffy or runny nose. Continue montelukast 10 mg one tablet once a day for allergy symptoms May use an over the counter antihistamine such as Zyrtec, Allegra, Xyzal or Claritin once a day as needed for runny nose or itching. Recommend can use over-the-counter Pataday 1 drop each eye daily as needed for itchy watery eyes  Asthma Continues to have good control Continue Symbicort 160- take 1 puff once a day. Rinse mouth out after to help prevent thrush.  If you are not meeting the below goals then increase to 2 puffs twice a day use Use albuterol 2 puffs every 4-6 hours as needed for coughing, wheezing, tightness in chest or shortness of breath. Also may use albuterol 2 puffs 5-15 minutes prior to exercise.  Asthma control goals:  Full participation in all desired activities (may need albuterol before activity) Albuterol use two time or less a week on average (not counting use with activity) Cough interfering with sleep two time or less a month Oral steroids no more than once a year No hospitalizations  Migraine Syndrome Continue Periactin 6 mg ( 1-1/2 tablets) at bedtime.  Continue all other medications. Please let us know if this treatment plan is not working well for you.  Schedule follow up in 6-9 months  I appreciate the opportunity to take part in Susan Castillo's care. Please do not hesitate to contact me with questions.  Sincerely,   Prudy Feeler, MD Allergy/Immunology [facility]

## 2021-10-17 NOTE — Patient Instructions (Addendum)
Allergic Rhinitis with conjunctivitis Continue Nasonex 1-2 sprays each nostril once a day as needed for stuffy or runny nose. Continue montelukast 10 mg one tablet once a day for allergy symptoms May use an over the counter antihistamine such as Zyrtec, Allegra, Xyzal or Claritin once a day as needed for runny nose or itching. Recommend can use over-the-counter Pataday 1 drop each eye daily as needed for itchy watery eyes  Asthma Continues to have good control Continue Symbicort 160- take 1 puff once a day. Rinse mouth out after to help prevent thrush.  If you are not meeting the below goals then increase to 2 puffs twice a day use Use albuterol 2 puffs every 4-6 hours as needed for coughing, wheezing, tightness in chest or shortness of breath. Also may use albuterol 2 puffs 5-15 minutes prior to exercise.  Asthma control goals:  Full participation in all desired activities (may need albuterol before activity) Albuterol use two time or less a week on average (not counting use with activity) Cough interfering with sleep two time or less a month Oral steroids no more than once a year No hospitalizations  Migraine Syndrome Continue Periactin 6 mg ( 1-1/2 tablets) at bedtime.  Continue all other medications. Please let us know if this treatment plan is not working well for you.  Schedule follow up in 6-9 months

## 2021-11-08 DIAGNOSIS — M0579 Rheumatoid arthritis with rheumatoid factor of multiple sites without organ or systems involvement: Secondary | ICD-10-CM | POA: Diagnosis not present

## 2021-11-20 ENCOUNTER — Other Ambulatory Visit: Payer: Self-pay | Admitting: Allergy

## 2021-12-28 ENCOUNTER — Ambulatory Visit: Payer: BC Managed Care – PPO | Admitting: Psychiatry

## 2022-01-03 ENCOUNTER — Ambulatory Visit: Payer: BC Managed Care – PPO | Admitting: Psychiatry

## 2022-01-03 DIAGNOSIS — M0579 Rheumatoid arthritis with rheumatoid factor of multiple sites without organ or systems involvement: Secondary | ICD-10-CM | POA: Diagnosis not present

## 2022-01-08 DIAGNOSIS — R7303 Prediabetes: Secondary | ICD-10-CM | POA: Diagnosis not present

## 2022-01-08 DIAGNOSIS — K760 Fatty (change of) liver, not elsewhere classified: Secondary | ICD-10-CM | POA: Diagnosis not present

## 2022-01-08 DIAGNOSIS — M0579 Rheumatoid arthritis with rheumatoid factor of multiple sites without organ or systems involvement: Secondary | ICD-10-CM | POA: Diagnosis not present

## 2022-01-08 DIAGNOSIS — E782 Mixed hyperlipidemia: Secondary | ICD-10-CM | POA: Diagnosis not present

## 2022-01-10 ENCOUNTER — Other Ambulatory Visit: Payer: Self-pay | Admitting: Psychiatry

## 2022-01-10 DIAGNOSIS — F3342 Major depressive disorder, recurrent, in full remission: Secondary | ICD-10-CM

## 2022-01-15 DIAGNOSIS — M0579 Rheumatoid arthritis with rheumatoid factor of multiple sites without organ or systems involvement: Secondary | ICD-10-CM | POA: Diagnosis not present

## 2022-01-15 DIAGNOSIS — K760 Fatty (change of) liver, not elsewhere classified: Secondary | ICD-10-CM | POA: Diagnosis not present

## 2022-01-15 DIAGNOSIS — R7303 Prediabetes: Secondary | ICD-10-CM | POA: Diagnosis not present

## 2022-01-15 DIAGNOSIS — E782 Mixed hyperlipidemia: Secondary | ICD-10-CM | POA: Diagnosis not present

## 2022-01-22 DIAGNOSIS — R635 Abnormal weight gain: Secondary | ICD-10-CM | POA: Diagnosis not present

## 2022-01-23 DIAGNOSIS — R748 Abnormal levels of other serum enzymes: Secondary | ICD-10-CM | POA: Diagnosis not present

## 2022-01-23 DIAGNOSIS — Z79899 Other long term (current) drug therapy: Secondary | ICD-10-CM | POA: Diagnosis not present

## 2022-01-23 DIAGNOSIS — M0579 Rheumatoid arthritis with rheumatoid factor of multiple sites without organ or systems involvement: Secondary | ICD-10-CM | POA: Diagnosis not present

## 2022-01-23 DIAGNOSIS — M1990 Unspecified osteoarthritis, unspecified site: Secondary | ICD-10-CM | POA: Diagnosis not present

## 2022-01-25 ENCOUNTER — Other Ambulatory Visit: Payer: Self-pay | Admitting: Internal Medicine

## 2022-01-25 DIAGNOSIS — Z1231 Encounter for screening mammogram for malignant neoplasm of breast: Secondary | ICD-10-CM

## 2022-01-30 DIAGNOSIS — Z1212 Encounter for screening for malignant neoplasm of rectum: Secondary | ICD-10-CM | POA: Diagnosis not present

## 2022-01-30 DIAGNOSIS — Z01419 Encounter for gynecological examination (general) (routine) without abnormal findings: Secondary | ICD-10-CM | POA: Diagnosis not present

## 2022-02-08 ENCOUNTER — Telehealth (INDEPENDENT_AMBULATORY_CARE_PROVIDER_SITE_OTHER): Payer: BC Managed Care – PPO | Admitting: Psychiatry

## 2022-02-08 ENCOUNTER — Encounter: Payer: Self-pay | Admitting: Psychiatry

## 2022-02-08 DIAGNOSIS — F3342 Major depressive disorder, recurrent, in full remission: Secondary | ICD-10-CM

## 2022-02-08 DIAGNOSIS — F5101 Primary insomnia: Secondary | ICD-10-CM | POA: Diagnosis not present

## 2022-02-08 MED ORDER — DULOXETINE HCL 30 MG PO CPEP: 30.0000 mg | ORAL_CAPSULE | Freq: Every day | ORAL | 3 refills | Status: DC

## 2022-02-08 MED ORDER — DULOXETINE HCL 60 MG PO CPEP: 60.0000 mg | ORAL_CAPSULE | Freq: Every morning | ORAL | 3 refills | Status: DC

## 2022-02-08 MED ORDER — TRAZODONE HCL 50 MG PO TABS: 50.0000 mg | ORAL_TABLET | Freq: Every day | ORAL | 3 refills | Status: DC

## 2022-02-08 MED ORDER — ARIPIPRAZOLE 5 MG PO TABS: 5.0000 mg | ORAL_TABLET | Freq: Every day | ORAL | 3 refills | Status: DC

## 2022-02-08 NOTE — Progress Notes (Signed)
Susan Castillo [id number] October 01, 1961 62 y.o.  Virtual Visit via Video Note  I connected with pt @ on 02/08/22 at  3:30 PM EST by a video enabled telemedicine application and verified that I am speaking with the correct person using two identifiers.   I discussed the limitations of evaluation and management by telemedicine and the availability of in person appointments. The patient expressed understanding and agreed to proceed.  I discussed the assessment and treatment plan with the patient. The patient was provided an opportunity to ask questions and all were answered. The patient agreed with the plan and demonstrated an understanding of the instructions.   The patient was advised to call back or seek an in-person evaluation if the symptoms worsen or if the condition fails to improve as anticipated.  I provided 25 minutes of non-face-to-face time during this encounter.  The patient was located at home.  The provider was located at [place].   Thayer Headings, PMHNP   Subjective:   Patient ID:  Susan Castillo is a 61 y.o. (DOB 15-Jun-1961) female.  Chief Complaint:  Chief Complaint  Patient presents with   Follow-up    Depression and anxiety    HPI Susan Castillo presents for follow-up of depression and anxiety. She reports that she has had some "deadline related stress." Denies anxiety. She reports that she has been having severe arthritis pain that is interfering with her ability to walk and this has affected motivation for physical activity. She reports that her motivation is good for work and spending time with family. Sleeping well. She reports that she could not get Trazodone for 3 days and noticed she did not sleep as well without it. Appetite has been "too good." She is trying not to snack at night. She reports that she is eating 3 meals a day. Concentration has been good for work. She has been training new employees. Denies anhedonia. Denies SI.   She reports  that she enjoys her job and has been there about a year. She works from home. Has been having difficulty driving since her vehicle is stick shift. Has missed being able to go for walks.   She has been seeing a therapist virtually and this "has helped a lot."   Son lives with her and reports that he has been supportive and helping her. She reports that sister-in-law, daughter, and son-in-law are supportive.   Has not needed Ativan prn recently.   Past Medication Trials: Cymbalta-effective Concerta Abilify Trazodone Rexulti Lorazepam  Review of Systems:  Review of Systems  Musculoskeletal:  Positive for arthralgias and gait problem.       Using a cane  Neurological:  Negative for tremors.  Psychiatric/Behavioral:         Please refer to HPI   She will see an orthopedic surgeon later this month, 2/20.  Medications: I have reviewed the patient's current medications.  Current Outpatient Medications  Medication Sig Dispense Refill   Acetaminophen (TYLENOL ARTHRITIS PAIN PO) Take by mouth.     albuterol (PROAIR HFA) 108 (90 Base) MCG/ACT inhaler INHALE 2 PUFFS BY MOUTH EVERY 4-6 HOURS AS NEEDED FOR COUGH OR WHEEZE 18 g 1   budesonide-formoterol (SYMBICORT) 160-4.5 MCG/ACT inhaler INHALE 2 PUFFS BY MOUTH EVERY DAY (Patient taking differently: 1 puff. INHALE 1 PUFFS BY MOUTH EVERY DAY) 10.2 each 3   Calcium Citrate-Vitamin D (CALCIUM + D PO) Take by mouth.     cyproheptadine (PERIACTIN) 4 MG tablet TAKE 1 & 1/2  TABLETS BY MOUTH EVERY DAYS AT BEDTIME 135 tablet 2   hydroxychloroquine (PLAQUENIL) 200 MG tablet Take 400 mg by mouth daily.     ketoconazole (NIZORAL) 2 % shampoo USE 2 TO 3 TIMES PER WEEK UTD  3   latanoprost (XALATAN) 0.005 % ophthalmic solution INT 1 GTT IN OU Q NIGHT UTD  11   mometasone (NASONEX) 50 MCG/ACT nasal spray Use one spray in each nostril once or twice daily 51 g 2   montelukast (SINGULAIR) 10 MG tablet Take one tablet once at bedtime. 90 tablet 2   Multiple  Vitamins-Minerals (MULTIVITAMIN ADULT PO) Take by mouth.     predniSONE (DELTASONE) 1 MG tablet Take 3 mg by mouth daily.     rosuvastatin (CRESTOR) 20 MG tablet      ARIPiprazole (ABILIFY) 5 MG tablet Take 1 tablet (5 mg total) by mouth daily. 90 tablet 3   DULoxetine (CYMBALTA) 30 MG capsule Take 1 capsule (30 mg total) by mouth daily. 90 capsule 3   DULoxetine (CYMBALTA) 60 MG capsule Take 1 capsule (60 mg total) by mouth every morning. 90 capsule 3   LORazepam (ATIVAN) 0.5 MG tablet Take 1/2-2 tablets every 6 hours as needed for anxiety and panic 90 tablet 1   traZODone (DESYREL) 50 MG tablet Take 1 tablet (50 mg total) by mouth at bedtime. 90 tablet 3   No current facility-administered medications for this visit.    Medication Side Effects: None Denies involuntary movements. No involuntary movements noted on video  Allergies:  Allergies  Allergen Reactions   Codeine Nausea And Vomiting    Past Medical History:  Diagnosis Date   Allergic rhinitis    Asthma    Glaucoma    Migraines    RA (rheumatoid arthritis) ([place])     Family History  Problem Relation Age of Onset   Hypertension Mother    Hypertension Father    Alcohol abuse Father    Diabetes type II Brother    Hypertension Brother    Allergic rhinitis Daughter    Anxiety disorder Daughter    Allergic rhinitis Son    Diabetes type II Brother    Hypertension Brother    Mood Disorder Sister     Social History   Socioeconomic History   Marital status: Widowed    Spouse name: Not on file   Number of children: Not on file   Years of education: Not on file   Highest education level: Not on file  Occupational History   Not on file  Tobacco Use   Smoking status: Never   Smokeless tobacco: Never  Vaping Use   Vaping Use: Never used  Substance and Sexual Activity   Alcohol use: Yes    Comment: extremely rare   Drug use: No   Sexual activity: Not on file  Other Topics Concern   Not on file  Social History  Narrative   Not on file   Social Determinants of Health   Financial Resource Strain: Not on file  Food Insecurity: Not on file  Transportation Needs: Not on file  Physical Activity: Not on file  Stress: Not on file  Social Connections: Not on file  Intimate Partner Violence: Not on file    Past Medical History, Surgical history, Social history, and Family history were reviewed and updated as appropriate.   Please see review of systems for further details on the patient's review from today.   Objective:   Physical Exam:  Wt 212 lb (  96.2 kg)    BMI 35.94 kg/m   Physical Exam Neurological:     Mental Status: She is alert and oriented to person, place, and time.     Cranial Nerves: No dysarthria.  Psychiatric:        Attention and Perception: Attention and perception normal.        Mood and Affect: Mood normal.        Speech: Speech normal.        Behavior: Behavior is cooperative.        Thought Content: Thought content normal. Thought content is not paranoid or delusional. Thought content does not include homicidal or suicidal ideation. Thought content does not include homicidal or suicidal plan.        Cognition and Memory: Cognition and memory normal.        Judgment: Judgment normal.     Comments: Insight intact    Lab Review:     Component Value Date/Time   NA 140 11/30/2009 1435   K 4.4 11/30/2009 1435   CL 107 11/30/2009 1435   CO2 26 11/30/2009 1435   GLUCOSE 100 (H) 11/30/2009 1435   BUN 11 11/30/2009 1435   CREATININE 0.77 11/30/2009 1435   CALCIUM 9.4 11/30/2009 1435   PROT 6.9 06/01/2009 0948   ALBUMIN 4.1 06/01/2009 0948   AST 36 06/01/2009 0948   ALT 21 06/01/2009 0948   ALKPHOS 81 06/01/2009 0948   BILITOT 0.6 06/01/2009 0948   GFRNONAA >60 11/30/2009 1435   GFRAA  11/30/2009 1435    >60        The eGFR has been calculated using the MDRD equation. This calculation has not been validated in all clinical situations. eGFR's persistently <60  mL/min signify possible Chronic Kidney Disease.       Component Value Date/Time   WBC 8.9 01/17/2018 0928   WBC 8.2 11/30/2009 1435   RBC 4.38 01/17/2018 0928   RBC 4.28 11/30/2009 1435   HGB 13.8 01/17/2018 0928   HCT 42.0 01/17/2018 0928   PLT 355 01/17/2018 0928   MCV 96 01/17/2018 0928   MCH 31.5 01/17/2018 0928   MCHC 32.9 01/17/2018 0928   MCHC 34.3 11/30/2009 1435   RDW 14.0 01/17/2018 0928   LYMPHSABS 2.0 01/17/2018 0928   MONOABS 1.0 06/09/2009 0441   EOSABS 0.1 01/17/2018 0928   BASOSABS 0.0 01/17/2018 0928    No results found for: POCLITH, LITHIUM   No results found for: PHENYTOIN, PHENOBARB, VALPROATE, CBMZ   .res Assessment: Plan:    Pt seen for 25 minutes and time spent reviewing changes in medical and social history. She reports that her mood and anxiety s/s has been well controlled despite severe knee pain and decreased mobility. She reports that psychotherapy has also been beneficial. Will continue current plan of care since target signs and symptoms are well controlled without any tolerability issues. Continue Abilify 5 mg po qd for depression.  Continue Cymbalta 90 mg daily for depression and anxiety.  Continue Trazodone 50 mg po QHS for insomnia.  She has Lorazepam 0.5 mg po qd prn severe anxiety available and reports that she does not need a refill at this time since she has not taken it recently. Pt to follow-up in 9 months or sooner if clinically indicated.  Patient advised to contact office with any questions, adverse effects, or acute worsening in signs and symptoms.   Mable was seen today for follow-up.  Diagnoses and all orders for this visit:  Major  depression, recurrent, full remission (HCC) -     ARIPiprazole (ABILIFY) 5 MG tablet; Take 1 tablet (5 mg total) by mouth daily. -     DULoxetine (CYMBALTA) 30 MG capsule; Take 1 capsule (30 mg total) by mouth daily. -     DULoxetine (CYMBALTA) 60 MG capsule; Take 1 capsule (60 mg total) by  mouth every morning.  Primary insomnia -     traZODone (DESYREL) 50 MG tablet; Take 1 tablet (50 mg total) by mouth at bedtime.     Please see After Visit Summary for patient specific instructions.  Future Appointments  Date Time Provider [place]  02/13/2022  3:00 PM [facility]    No orders of the defined types were placed in this encounter.     -------------------------------

## 2022-02-13 ENCOUNTER — Ambulatory Visit
Admission: RE | Admit: 2022-02-13 | Discharge: 2022-02-13 | Disposition: A | Discharge status: Home / Self Care | Payer: BC Managed Care – PPO | Source: Ambulatory Visit | Attending: Internal Medicine | Admitting: Internal Medicine

## 2022-02-13 DIAGNOSIS — Z1231 Encounter for screening mammogram for malignant neoplasm of breast: Secondary | ICD-10-CM | POA: Diagnosis not present

## 2022-02-19 DIAGNOSIS — M170 Bilateral primary osteoarthritis of knee: Secondary | ICD-10-CM | POA: Diagnosis not present

## 2022-02-19 DIAGNOSIS — M25562 Pain in left knee: Secondary | ICD-10-CM | POA: Diagnosis not present

## 2022-02-19 DIAGNOSIS — M25561 Pain in right knee: Secondary | ICD-10-CM | POA: Insufficient documentation

## 2022-02-28 DIAGNOSIS — M0579 Rheumatoid arthritis with rheumatoid factor of multiple sites without organ or systems involvement: Secondary | ICD-10-CM | POA: Diagnosis not present

## 2022-03-06 ENCOUNTER — Other Ambulatory Visit: Payer: Self-pay | Admitting: Physician Assistant

## 2022-03-06 DIAGNOSIS — M1711 Unilateral primary osteoarthritis, right knee: Secondary | ICD-10-CM

## 2022-03-28 ENCOUNTER — Ambulatory Visit
Admission: RE | Admit: 2022-03-28 | Discharge: 2022-03-28 | Disposition: A | Discharge status: Home / Self Care | Payer: BC Managed Care – PPO | Source: Ambulatory Visit | Attending: Physician Assistant | Admitting: Physician Assistant

## 2022-03-28 ENCOUNTER — Other Ambulatory Visit: Payer: Self-pay | Admitting: Physician Assistant

## 2022-03-28 DIAGNOSIS — M1711 Unilateral primary osteoarthritis, right knee: Secondary | ICD-10-CM

## 2022-03-28 DIAGNOSIS — M25561 Pain in right knee: Secondary | ICD-10-CM | POA: Diagnosis not present

## 2022-03-28 DIAGNOSIS — M25562 Pain in left knee: Secondary | ICD-10-CM | POA: Diagnosis not present

## 2022-03-28 DIAGNOSIS — M1712 Unilateral primary osteoarthritis, left knee: Secondary | ICD-10-CM

## 2022-04-19 DIAGNOSIS — H401132 Primary open-angle glaucoma, bilateral, moderate stage: Secondary | ICD-10-CM | POA: Diagnosis not present

## 2022-04-30 DIAGNOSIS — M170 Bilateral primary osteoarthritis of knee: Secondary | ICD-10-CM | POA: Diagnosis not present

## 2022-05-09 DIAGNOSIS — M25561 Pain in right knee: Secondary | ICD-10-CM | POA: Diagnosis not present

## 2022-05-09 DIAGNOSIS — M25562 Pain in left knee: Secondary | ICD-10-CM | POA: Diagnosis not present

## 2022-05-15 DIAGNOSIS — M25562 Pain in left knee: Secondary | ICD-10-CM | POA: Diagnosis not present

## 2022-05-15 DIAGNOSIS — M25561 Pain in right knee: Secondary | ICD-10-CM | POA: Diagnosis not present

## 2022-05-21 DIAGNOSIS — M0579 Rheumatoid arthritis with rheumatoid factor of multiple sites without organ or systems involvement: Secondary | ICD-10-CM | POA: Diagnosis not present

## 2022-05-23 DIAGNOSIS — M25561 Pain in right knee: Secondary | ICD-10-CM | POA: Diagnosis not present

## 2022-05-23 DIAGNOSIS — M25562 Pain in left knee: Secondary | ICD-10-CM | POA: Diagnosis not present

## 2022-05-30 ENCOUNTER — Other Ambulatory Visit: Payer: Self-pay | Admitting: Psychiatry

## 2022-05-30 DIAGNOSIS — F419 Anxiety disorder, unspecified: Secondary | ICD-10-CM

## 2022-05-31 DIAGNOSIS — M25561 Pain in right knee: Secondary | ICD-10-CM | POA: Diagnosis not present

## 2022-05-31 DIAGNOSIS — M25562 Pain in left knee: Secondary | ICD-10-CM | POA: Diagnosis not present

## 2022-06-13 DIAGNOSIS — M170 Bilateral primary osteoarthritis of knee: Secondary | ICD-10-CM | POA: Diagnosis not present

## 2022-06-20 DIAGNOSIS — M170 Bilateral primary osteoarthritis of knee: Secondary | ICD-10-CM | POA: Diagnosis not present

## 2022-06-27 DIAGNOSIS — M170 Bilateral primary osteoarthritis of knee: Secondary | ICD-10-CM | POA: Diagnosis not present

## 2022-07-24 DIAGNOSIS — H401132 Primary open-angle glaucoma, bilateral, moderate stage: Secondary | ICD-10-CM | POA: Diagnosis not present

## 2022-07-25 DIAGNOSIS — M0579 Rheumatoid arthritis with rheumatoid factor of multiple sites without organ or systems involvement: Secondary | ICD-10-CM | POA: Diagnosis not present

## 2022-07-31 DIAGNOSIS — L209 Atopic dermatitis, unspecified: Secondary | ICD-10-CM | POA: Diagnosis not present

## 2022-07-31 DIAGNOSIS — D485 Neoplasm of uncertain behavior of skin: Secondary | ICD-10-CM | POA: Diagnosis not present

## 2022-07-31 DIAGNOSIS — I781 Nevus, non-neoplastic: Secondary | ICD-10-CM | POA: Diagnosis not present

## 2022-08-06 ENCOUNTER — Other Ambulatory Visit: Payer: Self-pay

## 2022-08-06 ENCOUNTER — Other Ambulatory Visit: Payer: Self-pay | Admitting: Allergy

## 2022-08-06 DIAGNOSIS — R42 Dizziness and giddiness: Secondary | ICD-10-CM | POA: Insufficient documentation

## 2022-08-06 DIAGNOSIS — M069 Rheumatoid arthritis, unspecified: Secondary | ICD-10-CM | POA: Insufficient documentation

## 2022-08-06 DIAGNOSIS — M545 Low back pain, unspecified: Secondary | ICD-10-CM | POA: Insufficient documentation

## 2022-08-06 DIAGNOSIS — R5383 Other fatigue: Secondary | ICD-10-CM | POA: Insufficient documentation

## 2022-08-06 DIAGNOSIS — M791 Myalgia, unspecified site: Secondary | ICD-10-CM | POA: Insufficient documentation

## 2022-08-06 DIAGNOSIS — E782 Mixed hyperlipidemia: Secondary | ICD-10-CM | POA: Insufficient documentation

## 2022-08-06 DIAGNOSIS — K760 Fatty (change of) liver, not elsewhere classified: Secondary | ICD-10-CM | POA: Insufficient documentation

## 2022-08-06 DIAGNOSIS — R7303 Prediabetes: Secondary | ICD-10-CM | POA: Insufficient documentation

## 2022-08-08 ENCOUNTER — Other Ambulatory Visit: Payer: Self-pay | Admitting: Allergy

## 2022-08-09 ENCOUNTER — Telehealth: Payer: Self-pay | Admitting: *Deleted

## 2022-08-09 MED ORDER — BUDESONIDE-FORMOTEROL FUMARATE 160-4.5 MCG/ACT IN AERO: 1.0000 | INHALATION_SPRAY | Freq: Every day | RESPIRATORY_TRACT | 1 refills | Status: DC

## 2022-08-09 NOTE — Telephone Encounter (Signed)
Patient called the office stating she got a text from [facility] to refill her Symbicort 160 mcg.  Patient was been waiting for a refill from our office.  Reviewed chart with patient and informed patient refill was worked on and sent 08/06/22 electronically but there was error in transmission.  Informed patient she was past due for office visit with Dr. Nelva Bush.  Patient made a follow up visit with Dr. Nelva Bush for 09/25/22 at 11:00 am in [place].  I will let patient know if we get an earlier cancellation. Patient has been doing well with her asthma and is only using Symbicort 160 mcg 1 puff daily as she was at her last visit with Dr. Nelva Bush. Symbicort 160 mcg 1 puff daily [facility] [place]. Patient voiced understanding regarding appointment and medication.

## 2022-08-13 DIAGNOSIS — M0579 Rheumatoid arthritis with rheumatoid factor of multiple sites without organ or systems involvement: Secondary | ICD-10-CM | POA: Diagnosis not present

## 2022-08-13 DIAGNOSIS — R748 Abnormal levels of other serum enzymes: Secondary | ICD-10-CM | POA: Diagnosis not present

## 2022-08-13 DIAGNOSIS — M1990 Unspecified osteoarthritis, unspecified site: Secondary | ICD-10-CM | POA: Diagnosis not present

## 2022-08-13 DIAGNOSIS — Z79899 Other long term (current) drug therapy: Secondary | ICD-10-CM | POA: Diagnosis not present

## 2022-08-15 DIAGNOSIS — M1712 Unilateral primary osteoarthritis, left knee: Secondary | ICD-10-CM | POA: Diagnosis not present

## 2022-08-15 DIAGNOSIS — M1711 Unilateral primary osteoarthritis, right knee: Secondary | ICD-10-CM | POA: Diagnosis not present

## 2022-08-15 DIAGNOSIS — M170 Bilateral primary osteoarthritis of knee: Secondary | ICD-10-CM | POA: Diagnosis not present

## 2022-08-22 DIAGNOSIS — Z01818 Encounter for other preprocedural examination: Secondary | ICD-10-CM | POA: Diagnosis not present

## 2022-08-30 DIAGNOSIS — M1711 Unilateral primary osteoarthritis, right knee: Secondary | ICD-10-CM | POA: Diagnosis not present

## 2022-08-30 DIAGNOSIS — M25561 Pain in right knee: Secondary | ICD-10-CM | POA: Diagnosis not present

## 2022-09-04 DIAGNOSIS — R7303 Prediabetes: Secondary | ICD-10-CM | POA: Diagnosis not present

## 2022-09-04 DIAGNOSIS — K760 Fatty (change of) liver, not elsewhere classified: Secondary | ICD-10-CM | POA: Diagnosis not present

## 2022-09-04 DIAGNOSIS — E782 Mixed hyperlipidemia: Secondary | ICD-10-CM | POA: Diagnosis not present

## 2022-09-04 DIAGNOSIS — Z01818 Encounter for other preprocedural examination: Secondary | ICD-10-CM | POA: Diagnosis not present

## 2022-09-19 DIAGNOSIS — M0579 Rheumatoid arthritis with rheumatoid factor of multiple sites without organ or systems involvement: Secondary | ICD-10-CM | POA: Diagnosis not present

## 2022-09-25 ENCOUNTER — Ambulatory Visit: Payer: BC Managed Care – PPO | Admitting: Allergy

## 2022-09-25 ENCOUNTER — Encounter: Payer: Self-pay | Admitting: Allergy

## 2022-09-25 VITALS — BP 128/82 | HR 87 | Resp 16 | Ht 66.0 in | Wt 217.2 lb

## 2022-09-25 DIAGNOSIS — J4540 Moderate persistent asthma, uncomplicated: Secondary | ICD-10-CM

## 2022-09-25 DIAGNOSIS — H1013 Acute atopic conjunctivitis, bilateral: Secondary | ICD-10-CM | POA: Diagnosis not present

## 2022-09-25 DIAGNOSIS — J3089 Other allergic rhinitis: Secondary | ICD-10-CM | POA: Diagnosis not present

## 2022-09-25 DIAGNOSIS — G43909 Migraine, unspecified, not intractable, without status migrainosus: Secondary | ICD-10-CM | POA: Diagnosis not present

## 2022-09-25 MED ORDER — MONTELUKAST SODIUM 10 MG PO TABS
ORAL_TABLET | ORAL | 2 refills | Status: DC
Start: 2022-09-25 — End: 2023-07-24

## 2022-09-25 MED ORDER — CYPROHEPTADINE HCL 4 MG PO TABS: ORAL_TABLET | ORAL | 2 refills | Status: DC

## 2022-09-25 MED ORDER — MOMETASONE FUROATE 50 MCG/ACT NA SUSP: NASAL | 2 refills | Status: DC

## 2022-09-25 MED ORDER — ALBUTEROL SULFATE HFA 108 (90 BASE) MCG/ACT IN AERS: INHALATION_SPRAY | RESPIRATORY_TRACT | 1 refills | Status: DC

## 2022-09-25 MED ORDER — BUDESONIDE-FORMOTEROL FUMARATE 160-4.5 MCG/ACT IN AERO: 1.0000 | INHALATION_SPRAY | Freq: Every day | RESPIRATORY_TRACT | 5 refills | Status: DC

## 2022-09-25 NOTE — Progress Notes (Signed)
Follow-up Note  RE: Susan Castillo MRN: [id number] DOB: January 09, 1961 Date of Office Visit: 09/25/2022   History of present illness: Susan Castillo is a 61 y.o. female presenting today for follow-up of allergic rhinitis with conjunctivitis, asthma and migraine syndrome.  She was last seen in the office on 10/17/2021 by myself.  She has been doing well over the past year without any major health changes, hospitalizations or surgeries.  She will be having a total right knee replacement on 10/09/2022. She states with her asthma she has done quite well.  She only had symptoms of wheezing sensation of cough when the smoke from the [place] were more prevalent in the air.  She did need to use her albuterol a couple times related to this otherwise she denies albuterol use.  She has not had any ED or urgent care visits or any systemic steroid needs.  She has continued to take Symbicort 160 mcg 1 puff once a day.  She has not had any reason to increase this dose. With her allergies she also states this has been quite controlled.  She does use her nasal steroid spray Nasonex on a daily basis as well as Singulair.  She has not needed to really take any antihistamines which she will add on as needed basis if she had allergy symptoms. She continues to take cyproheptadine 1-1/2 tablets at bedtime for her migraine symptom control.  He continues to work well.  Review of systems in the past 4 weeks Review of Systems  Constitutional: Negative.   HENT: Negative.    Eyes: Negative.   Respiratory: Negative.    Cardiovascular: Negative.   Gastrointestinal: Negative.   Musculoskeletal: Negative.   Skin: Negative.   Allergic/Immunologic: Negative.   Neurological: Negative.      All other systems negative unless noted above in HPI  Past medical/social/surgical/family history have been reviewed and are unchanged unless specifically indicated below.  No changes  Medication List: Current  Outpatient Medications  Medication Sig Dispense Refill   Acetaminophen (TYLENOL ARTHRITIS PAIN PO) Take by mouth.     albuterol (VENTOLIN HFA) 108 (90 Base) MCG/ACT inhaler 2 inhalations every 4-6 hours as needed 18 g 1   ARIPiprazole (ABILIFY) 5 MG tablet Take 1 tablet (5 mg total) by mouth daily. 90 tablet 3   Calcium Citrate-Vitamin D (CALCIUM + D PO) Take by mouth.     chlorhexidine (PERIDEX) 0.12 % solution SMARTSIG:By Mouth     DULoxetine (CYMBALTA) 30 MG capsule Take 1 capsule (30 mg total) by mouth daily. 90 capsule 3   hydroxychloroquine (PLAQUENIL) 200 MG tablet Take 400 mg by mouth daily.     ketoconazole (NIZORAL) 2 % shampoo USE 2 TO 3 TIMES PER WEEK UTD  3   latanoprost (XALATAN) 0.005 % ophthalmic solution INT 1 GTT IN OU Q NIGHT UTD  11   LORazepam (ATIVAN) 0.5 MG tablet TAKE 1/2-2 TABLETS EVERY 6 HOURS AS NEEDED FOR ANXIETY AND PANIC 90 tablet 0   Multiple Vitamins-Minerals (MULTIVITAMIN ADULT PO) Take by mouth.     rosuvastatin (CRESTOR) 20 MG tablet      traZODone (DESYREL) 50 MG tablet Take 1 tablet (50 mg total) by mouth at bedtime. 90 tablet 3   budesonide-formoterol (SYMBICORT) 160-4.5 MCG/ACT inhaler Inhale 1 puff into the lungs daily. 10.2 g 5   cyproheptadine (PERIACTIN) 4 MG tablet TAKE 1 & 1/2 TABLETS BY MOUTH EVERY DAYS AT BEDTIME 135 tablet 2   DULoxetine (CYMBALTA) 60 MG  capsule Take 1 capsule (60 mg total) by mouth every morning. 90 capsule 3   mometasone (NASONEX) 50 MCG/ACT nasal spray Use one spray in each nostril once or twice daily 51 g 2   montelukast (SINGULAIR) 10 MG tablet Take one tablet once at bedtime. 90 tablet 2   No current facility-administered medications for this visit.     Known medication allergies: Allergies  Allergen Reactions   Codeine Nausea And Vomiting     Physical examination: Blood pressure 128/82, pulse 87, resp. rate 16, height '5\' 6"'$  (1.676 m), weight 217 lb 3.2 oz (98.5 kg), SpO2 96 %.  General: Alert, interactive, in no  acute distress. HEENT: PERRLA, TMs pearly gray, turbinates non-edematous without discharge, post-pharynx unremarkable. Neck: Supple without lymphadenopathy. Lungs: Clear to auscultation without wheezing, rhonchi or rales. {no increased work of breathing. CV: Normal S1, S2 without murmurs. Abdomen: Nondistended, nontender. Skin: Warm and dry, without lesions or rashes. Extremities:  No clubbing, cyanosis or edema. Neuro:   Grossly intact.  Diagnositics/Labs: None today  Assessment and plan:   Allergic Rhinitis with conjunctivitis Continue Nasonex 1-2 sprays each nostril once a day as needed for stuffy or runny nose. Continue montelukast 10 mg one tablet once a day for allergy symptoms. May use an over the counter antihistamine such as Zyrtec, Allegra, Xyzal or Claritin once a day as needed.  May use over-the-counter Pataday 1 drop each eye daily as needed for itchy watery eyes.  Asthma Under good control at this time. Continue Symbicort 160- take 1 puff once a day. Make sure to rinse mouth out after to help prevent thrush.   If not meeting below goals then increase Symbicort to 2 puffs twice a day or if sick with respiratory illness. Use albuterol 2 puffs every 4-6 hours as needed for coughing, wheezing, tightness in chest or shortness of breath. Also may use albuterol 2 puffs 5-15 minutes prior to exercise.  Asthma control goals:  Full participation in all desired activities (may need albuterol before activity) Albuterol use two time or less a week on average (not counting use with activity) Cough interfering with sleep two time or less a month Oral steroids no more than once a year No hospitalizations  Migraine Syndrome Continue Periactin 6 mg ( 1-1/2 tablets) at bedtime.  Vaccine counseling She reports will be receiving flu vaccine in upcoming weeks followed by new Covid booster I discussed the RSV vaccine that is eligible for those who is 60+ for protection against RSV.   Advised that I would recommend she receive this especially if she has exposure to young kids.  She was understanding and will look into this new vaccine as well  Follow-up in 1 year or sooner if needed  I appreciate the opportunity to take part in [facility]'s care. Please do not hesitate to contact me with questions.  Sincerely,   Prudy Feeler, MD Allergy/Immunology [facility]

## 2022-09-25 NOTE — Patient Instructions (Signed)
Allergic Rhinitis with conjunctivitis Continue Nasonex 1-2 sprays each nostril once a day as needed for stuffy or runny nose. Continue montelukast 10 mg one tablet once a day for allergy symptoms. May use an over the counter antihistamine such as Zyrtec, Allegra, Xyzal or Claritin once a day as needed.  May use over-the-counter Pataday 1 drop each eye daily as needed for itchy watery eyes.  Asthma Under good control at this time. Continue Symbicort 160- take 1 puff once a day. Make sure to rinse mouth out after to help prevent thrush.   If not meeting below goals then increase Symbicort to 2 puffs twice a day or if sick with respiratory illness. Use albuterol 2 puffs every 4-6 hours as needed for coughing, wheezing, tightness in chest or shortness of breath. Also may use albuterol 2 puffs 5-15 minutes prior to exercise.  Asthma control goals:  Full participation in all desired activities (may need albuterol before activity) Albuterol use two time or less a week on average (not counting use with activity) Cough interfering with sleep two time or less a month Oral steroids no more than once a year No hospitalizations  Migraine Syndrome Continue Periactin 6 mg ( 1-1/2 tablets) at bedtime.  Follow-up in 1 year or sooner if needed

## 2022-10-08 ENCOUNTER — Encounter: Payer: Self-pay | Admitting: Psychiatry

## 2022-10-08 ENCOUNTER — Telehealth (INDEPENDENT_AMBULATORY_CARE_PROVIDER_SITE_OTHER): Payer: BC Managed Care – PPO | Admitting: Psychiatry

## 2022-10-08 DIAGNOSIS — F3342 Major depressive disorder, recurrent, in full remission: Secondary | ICD-10-CM

## 2022-10-08 DIAGNOSIS — F419 Anxiety disorder, unspecified: Secondary | ICD-10-CM | POA: Diagnosis not present

## 2022-10-08 DIAGNOSIS — F5101 Primary insomnia: Secondary | ICD-10-CM

## 2022-10-08 MED ORDER — DULOXETINE HCL 60 MG PO CPEP: 60.0000 mg | ORAL_CAPSULE | Freq: Every morning | ORAL | 3 refills | Status: DC

## 2022-10-08 MED ORDER — ARIPIPRAZOLE 5 MG PO TABS: 5.0000 mg | ORAL_TABLET | Freq: Every day | ORAL | 3 refills | Status: DC

## 2022-10-08 MED ORDER — DULOXETINE HCL 30 MG PO CPEP: 30.0000 mg | ORAL_CAPSULE | Freq: Every day | ORAL | 3 refills | Status: DC

## 2022-10-08 MED ORDER — LORAZEPAM 0.5 MG PO TABS: ORAL_TABLET | ORAL | 1 refills | Status: DC

## 2022-10-08 MED ORDER — TRAZODONE HCL 50 MG PO TABS: 50.0000 mg | ORAL_TABLET | Freq: Every day | ORAL | 3 refills | Status: DC

## 2022-10-08 NOTE — Progress Notes (Signed)
Susan Castillo [id number] 07/27/1961 61 y.o.  Virtual Visit via Video Note  I connected with pt @ on 10/08/22 at  1:45 PM EDT by a video enabled telemedicine application and verified that I am speaking with the correct person using two identifiers.   I discussed the limitations of evaluation and management by telemedicine and the availability of in person appointments. The patient expressed understanding and agreed to proceed.  I discussed the assessment and treatment plan with the patient. The patient was provided an opportunity to ask questions and all were answered. The patient agreed with the plan and demonstrated an understanding of the instructions.   The patient was advised to call back or seek an in-person evaluation if the symptoms worsen or if the condition fails to improve as anticipated.  I provided 17 minutes of non-face-to-face time during this encounter.  The patient was located at home.  The provider was located at [place].   Thayer Headings, PMHNP   Subjective:   Patient ID:  Susan Castillo is a 61 y.o. (DOB [birth year]) female.  Chief Complaint:  Chief Complaint  Patient presents with   Follow-up    Depression, anxiety    HPI Susan Castillo presents for follow-up of depression and anxiety. She denies depressed mood. She has some anxiety about surgery tomorrow. Has right knee total knee replacement scheduled tomorrow. Denies panic attacks. Sleeping well and averaging 8-9 hours a night. Energy has been good. She reports that she is staying caught up at work. Concentration has been adequate. Appetite has been good. Denies anhedonia. Denies SI.   "I think we have me on the exact right set of meds."  She has needed Lorazepam prn more than usual since she has been riding as a passenger more. She reports that this has been effective and causes mild sleepiness.   She enjoys working from home and her job.   Past Medication  Trials: Cymbalta-effective Concerta Abilify Trazodone Rexulti Lorazepam  Review of Systems:  Review of Systems  Musculoskeletal:  Negative for gait problem.       Knee pain  Neurological:  Negative for tremors.  Psychiatric/Behavioral:         Please refer to HPI    Medications: I have reviewed the patient's current medications.  Current Outpatient Medications  Medication Sig Dispense Refill   Acetaminophen (TYLENOL ARTHRITIS PAIN PO) Take by mouth as needed.     albuterol (VENTOLIN HFA) 108 (90 Base) MCG/ACT inhaler 2 inhalations every 4-6 hours as needed 18 g 1   budesonide-formoterol (SYMBICORT) 160-4.5 MCG/ACT inhaler Inhale 1 puff into the lungs daily. 10.2 g 5   cyproheptadine (PERIACTIN) 4 MG tablet TAKE 1 & 1/2 TABLETS BY MOUTH EVERY DAYS AT BEDTIME 135 tablet 2   hydroxychloroquine (PLAQUENIL) 200 MG tablet Take 400 mg by mouth daily.     ketoconazole (NIZORAL) 2 % shampoo USE 2 TO 3 TIMES PER WEEK UTD  3   latanoprost (XALATAN) 0.005 % ophthalmic solution INT 1 GTT IN OU Q NIGHT UTD  11   mometasone (NASONEX) 50 MCG/ACT nasal spray Use one spray in each nostril once or twice daily 51 g 2   montelukast (SINGULAIR) 10 MG tablet Take one tablet once at bedtime. 90 tablet 2   rosuvastatin (CRESTOR) 20 MG tablet      ARIPiprazole (ABILIFY) 5 MG tablet Take 1 tablet (5 mg total) by mouth daily. 90 tablet 3   Calcium Citrate-Vitamin D (CALCIUM + D PO) Take by  mouth. (Patient not taking: Reported on 10/08/2022)     chlorhexidine (PERIDEX) 0.12 % solution SMARTSIG:By Mouth     DULoxetine (CYMBALTA) 30 MG capsule Take 1 capsule (30 mg total) by mouth daily. 90 capsule 3   DULoxetine (CYMBALTA) 60 MG capsule Take 1 capsule (60 mg total) by mouth every morning. 90 capsule 3   LORazepam (ATIVAN) 0.5 MG tablet TAKE 1/2-2 TABLETS EVERY 6 HOURS AS NEEDED FOR ANXIETY AND PANIC 90 tablet 1   Multiple Vitamins-Minerals (MULTIVITAMIN ADULT PO) Take by mouth. (Patient not taking: Reported on  10/08/2022)     traZODone (DESYREL) 50 MG tablet Take 1 tablet (50 mg total) by mouth at bedtime. 90 tablet 3   No current facility-administered medications for this visit.    Medication Side Effects: Other: Eye twitch with Lorazepam and some sleepiness  Allergies:  Allergies  Allergen Reactions   Codeine Nausea And Vomiting    Past Medical History:  Diagnosis Date   Allergic rhinitis    Asthma    Glaucoma    Migraines    RA (rheumatoid arthritis) ([place])     Family History  Problem Relation Age of Onset   Hypertension Mother    Hypertension Father    Alcohol abuse Father    Mood Disorder Sister    Allergic rhinitis Daughter    Anxiety disorder Daughter    Diabetes type II Brother    Hypertension Brother    Diabetes type II Brother    Hypertension Brother    Allergic rhinitis Son    Breast cancer Neg Hx     Social History   Socioeconomic History   Marital status: Widowed    Spouse name: Not on file   Number of children: Not on file   Years of education: Not on file   Highest education level: Not on file  Occupational History   Not on file  Tobacco Use   Smoking status: Never   Smokeless tobacco: Never  Vaping Use   Vaping Use: Never used  Substance and Sexual Activity   Alcohol use: Yes    Comment: extremely rare   Drug use: No   Sexual activity: Not on file  Other Topics Concern   Not on file  Social History Narrative   Not on file   Social Determinants of Health   Financial Resource Strain: Not on file  Food Insecurity: Not on file  Transportation Needs: Not on file  Physical Activity: Not on file  Stress: Not on file  Social Connections: Not on file  Intimate Partner Violence: Not on file    Past Medical History, Surgical history, Social history, and Family history were reviewed and updated as appropriate.   Please see review of systems for further details on the patient's review from today.   Objective:   Physical Exam:  BP 122/82    Wt 215 lb (97.5 kg)   BMI 34.70 kg/m   Physical Exam Constitutional:      General: She is not in acute distress. Musculoskeletal:        General: No deformity.  Neurological:     Mental Status: She is alert and oriented to person, place, and time.     Coordination: Coordination normal.  Psychiatric:        Attention and Perception: Attention and perception normal. She does not perceive auditory or visual hallucinations.        Mood and Affect: Mood normal. Mood is not anxious or depressed. Affect is not labile,  blunt, angry or inappropriate.        Speech: Speech normal.        Behavior: Behavior normal.        Thought Content: Thought content normal. Thought content is not paranoid or delusional. Thought content does not include homicidal or suicidal ideation. Thought content does not include homicidal or suicidal plan.        Cognition and Memory: Cognition and memory normal.        Judgment: Judgment normal.     Comments: Insight intact     Lab Review:     Component Value Date/Time   NA 140 11/30/2009 1435   K 4.4 11/30/2009 1435   CL 107 11/30/2009 1435   CO2 26 11/30/2009 1435   GLUCOSE 100 (H) 11/30/2009 1435   BUN 11 11/30/2009 1435   CREATININE 0.77 11/30/2009 1435   CALCIUM 9.4 11/30/2009 1435   PROT 6.9 06/01/2009 0948   ALBUMIN 4.1 06/01/2009 0948   AST 36 06/01/2009 0948   ALT 21 06/01/2009 0948   ALKPHOS 81 06/01/2009 0948   BILITOT 0.6 06/01/2009 0948   GFRNONAA >60 11/30/2009 1435   GFRAA  11/30/2009 1435    >60        The eGFR has been calculated using the MDRD equation. This calculation has not been validated in all clinical situations. eGFR's persistently <60 mL/min signify possible Chronic Kidney Disease.       Component Value Date/Time   WBC 8.9 01/17/2018 0928   WBC 8.2 11/30/2009 1435   RBC 4.38 01/17/2018 0928   RBC 4.28 11/30/2009 1435   HGB 13.8 01/17/2018 0928   HCT 42.0 01/17/2018 0928   PLT 355 01/17/2018 0928   MCV 96  01/17/2018 0928   MCH 31.5 01/17/2018 0928   MCHC 32.9 01/17/2018 0928   MCHC 34.3 11/30/2009 1435   RDW 14.0 01/17/2018 0928   LYMPHSABS 2.0 01/17/2018 0928   MONOABS 1.0 06/09/2009 0441   EOSABS 0.1 01/17/2018 0928   BASOSABS 0.0 01/17/2018 0928    No results found for: "POCLITH", "LITHIUM"   No results found for: "PHENYTOIN", "PHENOBARB", "VALPROATE", "CBMZ"   .res Assessment: Plan:   Will continue current plan of care since target signs and symptoms are well controlled without any tolerability issues. Continue Cymbalta 90 mg po qd for anxiety and depression.  Continue Abilify 5 mg po qd for augmentation of depression.  Continue Trazodone 50 mg po QHS for insomnia.  Continue Lorazepam 0.5 mg 1/2-2 tabs po q 6 hours prn anxiety and panic.  Pt to follow-up in 6 months or sooner if clinically indicated.  Patient advised to contact office with any questions, adverse effects, or acute worsening in signs and symptoms.   Deneene was seen today for follow-up.  Diagnoses and all orders for this visit:  Major depression, recurrent, full remission ([place]) -     DULoxetine (CYMBALTA) 30 MG capsule; Take 1 capsule (30 mg total) by mouth daily. -     DULoxetine (CYMBALTA) 60 MG capsule; Take 1 capsule (60 mg total) by mouth every morning. -     ARIPiprazole (ABILIFY) 5 MG tablet; Take 1 tablet (5 mg total) by mouth daily.  Primary insomnia -     traZODone (DESYREL) 50 MG tablet; Take 1 tablet (50 mg total) by mouth at bedtime.  Anxiety disorder, unspecified type -     LORazepam (ATIVAN) 0.5 MG tablet; TAKE 1/2-2 TABLETS EVERY 6 HOURS AS NEEDED FOR ANXIETY AND PANIC  Please see After Visit Summary for patient specific instructions.  Future Appointments  Date Time Provider [place]  09/24/2023  4:00 PM Kennith Gain, MD [facility] None    No orders of the defined types were placed in this encounter.     -------------------------------

## 2022-10-09 DIAGNOSIS — G8918 Other acute postprocedural pain: Secondary | ICD-10-CM | POA: Diagnosis not present

## 2022-10-09 DIAGNOSIS — M1711 Unilateral primary osteoarthritis, right knee: Secondary | ICD-10-CM | POA: Diagnosis not present

## 2022-10-12 DIAGNOSIS — Z96651 Presence of right artificial knee joint: Secondary | ICD-10-CM | POA: Diagnosis not present

## 2022-10-12 DIAGNOSIS — R2689 Other abnormalities of gait and mobility: Secondary | ICD-10-CM | POA: Diagnosis not present

## 2022-10-12 DIAGNOSIS — M25561 Pain in right knee: Secondary | ICD-10-CM | POA: Diagnosis not present

## 2022-10-12 DIAGNOSIS — Z4733 Aftercare following explantation of knee joint prosthesis: Secondary | ICD-10-CM | POA: Diagnosis not present

## 2022-10-15 DIAGNOSIS — Z96651 Presence of right artificial knee joint: Secondary | ICD-10-CM | POA: Diagnosis not present

## 2022-10-15 DIAGNOSIS — Z4733 Aftercare following explantation of knee joint prosthesis: Secondary | ICD-10-CM | POA: Diagnosis not present

## 2022-10-15 DIAGNOSIS — M25561 Pain in right knee: Secondary | ICD-10-CM | POA: Diagnosis not present

## 2022-10-15 DIAGNOSIS — R2689 Other abnormalities of gait and mobility: Secondary | ICD-10-CM | POA: Diagnosis not present

## 2022-10-17 DIAGNOSIS — Z96651 Presence of right artificial knee joint: Secondary | ICD-10-CM | POA: Diagnosis not present

## 2022-10-17 DIAGNOSIS — Z4733 Aftercare following explantation of knee joint prosthesis: Secondary | ICD-10-CM | POA: Diagnosis not present

## 2022-10-17 DIAGNOSIS — M25561 Pain in right knee: Secondary | ICD-10-CM | POA: Diagnosis not present

## 2022-10-17 DIAGNOSIS — R2689 Other abnormalities of gait and mobility: Secondary | ICD-10-CM | POA: Diagnosis not present

## 2022-10-19 DIAGNOSIS — Z96651 Presence of right artificial knee joint: Secondary | ICD-10-CM | POA: Diagnosis not present

## 2022-10-19 DIAGNOSIS — M25561 Pain in right knee: Secondary | ICD-10-CM | POA: Diagnosis not present

## 2022-10-19 DIAGNOSIS — Z4733 Aftercare following explantation of knee joint prosthesis: Secondary | ICD-10-CM | POA: Diagnosis not present

## 2022-10-19 DIAGNOSIS — R2689 Other abnormalities of gait and mobility: Secondary | ICD-10-CM | POA: Diagnosis not present

## 2022-10-22 DIAGNOSIS — R2689 Other abnormalities of gait and mobility: Secondary | ICD-10-CM | POA: Diagnosis not present

## 2022-10-22 DIAGNOSIS — M25561 Pain in right knee: Secondary | ICD-10-CM | POA: Diagnosis not present

## 2022-10-22 DIAGNOSIS — Z4733 Aftercare following explantation of knee joint prosthesis: Secondary | ICD-10-CM | POA: Diagnosis not present

## 2022-10-22 DIAGNOSIS — Z96651 Presence of right artificial knee joint: Secondary | ICD-10-CM | POA: Diagnosis not present

## 2022-10-24 DIAGNOSIS — Z96651 Presence of right artificial knee joint: Secondary | ICD-10-CM | POA: Diagnosis not present

## 2022-10-24 DIAGNOSIS — M25561 Pain in right knee: Secondary | ICD-10-CM | POA: Diagnosis not present

## 2022-10-24 DIAGNOSIS — Z4733 Aftercare following explantation of knee joint prosthesis: Secondary | ICD-10-CM | POA: Diagnosis not present

## 2022-10-24 DIAGNOSIS — R2689 Other abnormalities of gait and mobility: Secondary | ICD-10-CM | POA: Diagnosis not present

## 2022-10-26 DIAGNOSIS — Z96651 Presence of right artificial knee joint: Secondary | ICD-10-CM | POA: Diagnosis not present

## 2022-10-26 DIAGNOSIS — R2689 Other abnormalities of gait and mobility: Secondary | ICD-10-CM | POA: Diagnosis not present

## 2022-10-26 DIAGNOSIS — Z4733 Aftercare following explantation of knee joint prosthesis: Secondary | ICD-10-CM | POA: Diagnosis not present

## 2022-10-26 DIAGNOSIS — M25561 Pain in right knee: Secondary | ICD-10-CM | POA: Diagnosis not present

## 2022-10-30 DIAGNOSIS — M25561 Pain in right knee: Secondary | ICD-10-CM | POA: Diagnosis not present

## 2022-10-30 DIAGNOSIS — Z4733 Aftercare following explantation of knee joint prosthesis: Secondary | ICD-10-CM | POA: Diagnosis not present

## 2022-10-30 DIAGNOSIS — R2689 Other abnormalities of gait and mobility: Secondary | ICD-10-CM | POA: Diagnosis not present

## 2022-10-30 DIAGNOSIS — Z96651 Presence of right artificial knee joint: Secondary | ICD-10-CM | POA: Diagnosis not present

## 2022-11-01 DIAGNOSIS — R2689 Other abnormalities of gait and mobility: Secondary | ICD-10-CM | POA: Diagnosis not present

## 2022-11-01 DIAGNOSIS — M25561 Pain in right knee: Secondary | ICD-10-CM | POA: Diagnosis not present

## 2022-11-01 DIAGNOSIS — Z4733 Aftercare following explantation of knee joint prosthesis: Secondary | ICD-10-CM | POA: Diagnosis not present

## 2022-11-01 DIAGNOSIS — Z96651 Presence of right artificial knee joint: Secondary | ICD-10-CM | POA: Diagnosis not present

## 2022-11-14 DIAGNOSIS — M0579 Rheumatoid arthritis with rheumatoid factor of multiple sites without organ or systems involvement: Secondary | ICD-10-CM | POA: Diagnosis not present

## 2022-11-18 ENCOUNTER — Other Ambulatory Visit: Payer: Self-pay | Admitting: Allergy

## 2022-11-21 DIAGNOSIS — Z96651 Presence of right artificial knee joint: Secondary | ICD-10-CM | POA: Diagnosis not present

## 2022-11-21 DIAGNOSIS — R2689 Other abnormalities of gait and mobility: Secondary | ICD-10-CM | POA: Diagnosis not present

## 2022-11-21 DIAGNOSIS — Z4733 Aftercare following explantation of knee joint prosthesis: Secondary | ICD-10-CM | POA: Diagnosis not present

## 2022-11-21 DIAGNOSIS — M25561 Pain in right knee: Secondary | ICD-10-CM | POA: Diagnosis not present

## 2022-11-26 DIAGNOSIS — Z96651 Presence of right artificial knee joint: Secondary | ICD-10-CM | POA: Diagnosis not present

## 2022-11-26 DIAGNOSIS — M25561 Pain in right knee: Secondary | ICD-10-CM | POA: Diagnosis not present

## 2022-11-26 DIAGNOSIS — Z4733 Aftercare following explantation of knee joint prosthesis: Secondary | ICD-10-CM | POA: Diagnosis not present

## 2022-11-26 DIAGNOSIS — R2689 Other abnormalities of gait and mobility: Secondary | ICD-10-CM | POA: Diagnosis not present

## 2022-12-04 ENCOUNTER — Telehealth: Payer: Self-pay

## 2022-12-04 NOTE — Patient Instructions (Signed)
Visit Information  Thank you for taking time to visit with me today. Please don't hesitate to contact me if I can be of assistance to you.   Following are the goals we discussed today:   Goals Addressed             This Visit's Progress    COMPLETED: Care Coordination  Activites-No follow up required       Care Coordination Interventions: Advised patient to Schedule follow ups. Discussed [facility] services and support.  Patient decline at this time.            If you are experiencing a Mental Health or [place] or need someone to talk to, please call the Suicide and Crisis Lifeline: 988   Patient verbalizes understanding of instructions and care plan provided today and agrees to view in [place]. Active MyChart status and patient understanding of how to access instructions and care plan via MyChart confirmed with patient.     Telephone follow up appointment with care management team member scheduled for: No further follow up required: decline  Jone Baseman, RN, MSN [place] [facility] Care Management Coordinator Direct Line [phone]

## 2022-12-04 NOTE — Patient Outreach (Signed)
  Care Coordination   Initial Visit Note   12/04/2022 Name: Susan Castillo MRN: [id number] DOB: May 08, 1961  Susan Castillo is a 61 y.o. year old female who sees Merrilee Seashore, MD for primary care. I spoke with  Fara Olden by phone today.  What matters to the patients health and wellness today?  none    Goals Addressed             This Visit's Progress    COMPLETED: Care Coordination  Activites-No follow up required       Care Coordination Interventions: Advised patient to Schedule follow ups. Discussed [facility] services and support.  Patient decline at this time.            SDOH assessments and interventions completed:  Yes  SDOH Interventions Today    Flowsheet Row Most Recent Value  SDOH Interventions   Housing Interventions Intervention Not Indicated  Transportation Interventions Intervention Not Indicated        Care Coordination Interventions:  Yes, provided   Follow up plan: No further intervention required.   Encounter Outcome:  Pt. Visit Completed    Jone Baseman, RN, MSN [place] [facility] Care Management Coordinator Direct Line [phone]

## 2022-12-11 DIAGNOSIS — R2689 Other abnormalities of gait and mobility: Secondary | ICD-10-CM | POA: Diagnosis not present

## 2022-12-11 DIAGNOSIS — Z4733 Aftercare following explantation of knee joint prosthesis: Secondary | ICD-10-CM | POA: Diagnosis not present

## 2022-12-11 DIAGNOSIS — Z96651 Presence of right artificial knee joint: Secondary | ICD-10-CM | POA: Diagnosis not present

## 2022-12-11 DIAGNOSIS — M25561 Pain in right knee: Secondary | ICD-10-CM | POA: Diagnosis not present

## 2022-12-13 DIAGNOSIS — M0579 Rheumatoid arthritis with rheumatoid factor of multiple sites without organ or systems involvement: Secondary | ICD-10-CM | POA: Diagnosis not present

## 2022-12-13 DIAGNOSIS — R748 Abnormal levels of other serum enzymes: Secondary | ICD-10-CM | POA: Diagnosis not present

## 2022-12-13 DIAGNOSIS — Z79899 Other long term (current) drug therapy: Secondary | ICD-10-CM | POA: Diagnosis not present

## 2022-12-13 DIAGNOSIS — M1990 Unspecified osteoarthritis, unspecified site: Secondary | ICD-10-CM | POA: Diagnosis not present

## 2023-01-03 DIAGNOSIS — Z4733 Aftercare following explantation of knee joint prosthesis: Secondary | ICD-10-CM | POA: Diagnosis not present

## 2023-01-03 DIAGNOSIS — M25561 Pain in right knee: Secondary | ICD-10-CM | POA: Diagnosis not present

## 2023-01-03 DIAGNOSIS — Z96651 Presence of right artificial knee joint: Secondary | ICD-10-CM | POA: Diagnosis not present

## 2023-01-03 DIAGNOSIS — R2689 Other abnormalities of gait and mobility: Secondary | ICD-10-CM | POA: Diagnosis not present

## 2023-02-26 ENCOUNTER — Telehealth: Payer: Self-pay | Admitting: Allergy and Immunology

## 2023-02-26 NOTE — Telephone Encounter (Signed)
Patient states she is not needing refills at the moment but wanted to inform us that Albuterol and Symbicort will no longer be covered by her INS so she will be needing an alternative. I did inform her to give the office a call when she is needing refills sent in.

## 2023-04-08 ENCOUNTER — Other Ambulatory Visit: Payer: Self-pay | Admitting: Internal Medicine

## 2023-04-08 DIAGNOSIS — Z0000 Encounter for general adult medical examination without abnormal findings: Secondary | ICD-10-CM

## 2023-04-15 ENCOUNTER — Ambulatory Visit (INDEPENDENT_AMBULATORY_CARE_PROVIDER_SITE_OTHER): Payer: BC Managed Care – PPO | Admitting: Psychiatry

## 2023-04-15 ENCOUNTER — Encounter: Payer: Self-pay | Admitting: Psychiatry

## 2023-04-15 DIAGNOSIS — F5101 Primary insomnia: Secondary | ICD-10-CM

## 2023-04-15 DIAGNOSIS — F419 Anxiety disorder, unspecified: Secondary | ICD-10-CM | POA: Diagnosis not present

## 2023-04-15 DIAGNOSIS — F3342 Major depressive disorder, recurrent, in full remission: Secondary | ICD-10-CM

## 2023-04-15 MED ORDER — DULOXETINE HCL 60 MG PO CPEP: 60.0000 mg | ORAL_CAPSULE | Freq: Every morning | ORAL | 3 refills | Status: DC

## 2023-04-15 MED ORDER — TRAZODONE HCL 50 MG PO TABS: 50.0000 mg | ORAL_TABLET | Freq: Every day | ORAL | 3 refills | Status: DC

## 2023-04-15 MED ORDER — LORAZEPAM 0.5 MG PO TABS: ORAL_TABLET | ORAL | 1 refills | Status: DC

## 2023-04-15 MED ORDER — DULOXETINE HCL 30 MG PO CPEP: 30.0000 mg | ORAL_CAPSULE | Freq: Every day | ORAL | 3 refills | Status: DC

## 2023-04-15 MED ORDER — ARIPIPRAZOLE 5 MG PO TABS: 5.0000 mg | ORAL_TABLET | Freq: Every day | ORAL | 3 refills | Status: DC

## 2023-04-15 NOTE — Progress Notes (Unsigned)
Susan Castillo [id number] 02/22/1961 62 y.o.  Subjective:   Patient ID:  Susan Castillo is a 62 y.o. (DOB 11/09/1961) female.  Chief Complaint:  Chief Complaint  Patient presents with   Anxiety   Follow-up    Depression    HPI Susan Castillo presents to the office today for follow-up of depression and anxiety. She denies depression- "none at all." She notices more anxiety with driving and being driven. She does not take Lorazepam when driving 2-69 minutes. She reports that she will take it when she is in a vehicle for longer than 10-15 minutes. She attributes anxiety from riding with a father that would drive drunk, her mother would get in multiple accidents, and older sister would drive recklessly. She used to have anxiety only with riding as a passenger, even with cautious drivers. She now notices anxiety with driving herself. She reports that her anxiety is "specific to me being in a moving vehicle." She denies any other anxiety. Sleeping very well with Trazodone. She reports that she was without Trazodone for 3 days and was not able to sleep during those days. Energy and motivation have improved, "especially now that my knee has healed up." She has been walking at times during her breaks. She is now able to do her own house work. Appetite is "too good." She reports losing 17 lbs after surgery and then gained it back. She reports that she has been snacking in the evening. Concentration is fine. Denies SI.   Son lives with her. 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   She is looking forward to going to the beach now that her knee has improved.   Lorazepam last filled on 01/22/23.   Past Medication Trials: Cymbalta-effective Concerta Abilify Trazodone Rexulti Lorazepam  AIMS    Flowsheet Row Office Visit from 04/15/2023 in [facility] Video Visit from 10/08/2022 in [facility] Office Visit from 06/23/2021 in [facility] Office Visit from 03/24/2021 in [facility] Office Visit from 08/20/2019 in [facility]  AIMS Total Score 0 0 0 0 0      PHQ2-9    Flowsheet Row Telephone from 12/04/2022 in [facility]  PHQ-2 Total Score 0        Review of Systems:  Review of Systems  Musculoskeletal:  Negative for gait problem.       Occ back pain. She no longer has knee pain.  Neurological:  Negative for tremors.  Psychiatric/Behavioral:         Please refer to HPI    Medications: I have reviewed the patient's current medications.  Current Outpatient Medications  Medication Sig Dispense Refill   budesonide-formoterol (SYMBICORT) 160-4.5 MCG/ACT inhaler Inhale 1 puff into the lungs daily. 10.2 g 5   Calcium Citrate-Vitamin D (CALCIUM + D PO) Take by mouth.     cyproheptadine (PERIACTIN) 4 MG tablet TAKE 1 & 1/2 TABLETS BY MOUTH EVERY DAYS AT BEDTIME 135 tablet 2   golimumab (SIMPONI ARIA) 50 MG/4ML SOLN injection Inject 50 mg into the vein every 8 (eight) weeks.     hydroxychloroquine (PLAQUENIL) 200 MG tablet Take 400 mg by mouth daily.     ketoconazole (NIZORAL) 2 % shampoo USE 2 TO 3 TIMES PER WEEK UTD  3   latanoprost (XALATAN) 0.005 % ophthalmic solution INT 1  GTT IN OU Q NIGHT UTD  11   mometasone (NASONEX) 50 MCG/ACT nasal spray Use one spray in each nostril once or twice daily 51 g 2   montelukast (SINGULAIR) 10 MG tablet Take one tablet once at bedtime. 90 tablet 2   Multiple Vitamins-Minerals (MULTIVITAMIN ADULT PO) Take by mouth.     rosuvastatin (CRESTOR) 20 MG tablet      albuterol (VENTOLIN HFA) 108 (90 Base) MCG/ACT inhaler INHALE 2 PUFFS BY MOUTH EVERY 4-6 HOURS AS NEEDED 18 g 1   ARIPiprazole (ABILIFY) 5 MG tablet Take 1 tablet (5 mg total) by mouth daily. 90 tablet 3   DULoxetine (CYMBALTA) 30 MG capsule Take  1 capsule (30 mg total) by mouth daily. 90 capsule 3   DULoxetine (CYMBALTA) 60 MG capsule Take 1 capsule (60 mg total) by mouth every morning. 90 capsule 3   LORazepam (ATIVAN) 0.5 MG tablet TAKE 1/2-2 TABLETS EVERY 6 HOURS AS NEEDED FOR ANXIETY AND PANIC 90 tablet 1   traZODone (DESYREL) 50 MG tablet Take 1 tablet (50 mg total) by mouth at bedtime. 90 tablet 3   No current facility-administered medications for this visit.    Medication Side Effects: Other: Possible twitch in her eyelid.   Allergies:  Allergies  Allergen Reactions   Codeine Nausea And Vomiting    Past Medical History:  Diagnosis Date   Allergic rhinitis    Asthma    Glaucoma    Migraines    RA (rheumatoid arthritis)     Past Medical History, Surgical history, Social history, and Family history were reviewed and updated as appropriate.   Please see review of systems for further details on the patient's review from today.   Objective:   Physical Exam:  There were no vitals taken for this visit.  Physical Exam  Lab Review:     Component Value Date/Time   NA 140 11/30/2009 1435   K 4.4 11/30/2009 1435   CL 107 11/30/2009 1435   CO2 26 11/30/2009 1435   GLUCOSE 100 (H) 11/30/2009 1435   BUN 11 11/30/2009 1435   CREATININE 0.77 11/30/2009 1435   CALCIUM 9.4 11/30/2009 1435   PROT 6.9 06/01/2009 0948   ALBUMIN 4.1 06/01/2009 0948   AST 36 06/01/2009 0948   ALT 21 06/01/2009 0948   ALKPHOS 81 06/01/2009 0948   BILITOT 0.6 06/01/2009 0948   GFRNONAA >60 11/30/2009 1435   GFRAA  11/30/2009 1435    >60        The eGFR has been calculated using the MDRD equation. This calculation has not been validated in all clinical situations. eGFR's persistently <60 mL/min signify possible Chronic Kidney Disease.       Component Value Date/Time   WBC 8.9 01/17/2018 0928   WBC 8.2 11/30/2009 1435   RBC 4.38 01/17/2018 0928   RBC 4.28 11/30/2009 1435   HGB 13.8 01/17/2018 0928   HCT 42.0 01/17/2018 0928    PLT 355 01/17/2018 0928   MCV 96 01/17/2018 0928   MCH 31.5 01/17/2018 0928   MCHC 32.9 01/17/2018 0928   MCHC 34.3 11/30/2009 1435   RDW 14.0 01/17/2018 0928   LYMPHSABS 2.0 01/17/2018 0928   MONOABS 1.0 06/09/2009 0441   EOSABS 0.1 01/17/2018 0928   BASOSABS 0.0 01/17/2018 0928    No results found for: "POCLITH", "LITHIUM"   No results found for: "PHENYTOIN", "PHENOBARB", "VALPROATE", "CBMZ"   .res Assessment: Plan:   Pt seen for 30 minutes and time spent discussing anxiety  related to driving and riding as a passenger. Recommended EMDR to help reduce anxiety and discussed possible referrals.  Will continue current plan of care since target signs and symptoms are well controlled without any tolerability issues. Continue Cymbalta 90 mg po qd for depression and anxiety.  Continue Abilify 5 mg po qd for depression.  Continue Lorazepam 0.5 mg 1/2-2 tabs po q 6 hours as needed for anxiety or panic.  Continue Trazodone 50 mg po QHS for insomnia.  Pt to follow-up in one year or sooner if clinically indicated. Patient advised to contact office with any questions, adverse effects, or acute worsening in signs and symptoms.  Susan Castillo was seen today for anxiety and follow-up.  Diagnoses and all orders for this visit:  Anxiety disorder, unspecified type -     LORazepam (ATIVAN) 0.5 MG tablet; TAKE 1/2-2 TABLETS EVERY 6 HOURS AS NEEDED FOR ANXIETY AND PANIC  Primary insomnia -     traZODone (DESYREL) 50 MG tablet; Take 1 tablet (50 mg total) by mouth at bedtime.  Major depression, recurrent, full remission -     DULoxetine (CYMBALTA) 60 MG capsule; Take 1 capsule (60 mg total) by mouth every morning. -     DULoxetine (CYMBALTA) 30 MG capsule; Take 1 capsule (30 mg total) by mouth daily. -     ARIPiprazole (ABILIFY) 5 MG tablet; Take 1 tablet (5 mg total) by mouth daily.     Please see After Visit Summary for patient specific instructions.  Future Appointments  Date Time Provider  Department Center  05/16/2023  4:40 PM [facility]  09/24/2023  4:00 PM Marcelyn Bruins, MD [facility] None  04/16/2024  4:00 PM Corie Chiquito, PMHNP [facility] None    No orders of the defined types were placed in this encounter.   -------------------------------

## 2023-04-17 ENCOUNTER — Other Ambulatory Visit: Payer: Self-pay | Admitting: *Deleted

## 2023-04-17 MED ORDER — SYMBICORT 160-4.5 MCG/ACT IN AERO: INHALATION_SPRAY | RESPIRATORY_TRACT | 5 refills | Status: DC

## 2023-04-17 NOTE — Telephone Encounter (Signed)
Patient called and states she is needing a refill on her Symbicort inhaler. Since it is not covered by her INS anymore she will be needing an alternative sent to [facility] in [place].

## 2023-04-17 NOTE — Telephone Encounter (Signed)
It appears that Brand Symbicort is covered - we sent generic last time. I have sent the brand and we will see if it gets covered.

## 2023-05-02 ENCOUNTER — Telehealth: Payer: Self-pay | Admitting: Allergy and Immunology

## 2023-05-02 NOTE — Telephone Encounter (Signed)
Error

## 2023-05-02 NOTE — Telephone Encounter (Signed)
Received fax from [facility] stating that insurance will only cover Fluticasone Salmeterol 250-50, Wixela 250-50, and Breo 100-25.  Please advise.

## 2023-05-02 NOTE — Telephone Encounter (Signed)
Patient states her insurance is not covering Generic Symbicort. She is running very low on her inhaler and is needing an alternative sent in as soon as possible to [facility] in [place].

## 2023-05-06 MED ORDER — BREO ELLIPTA 200-25 MCG/ACT IN AEPB
INHALATION_SPRAY | RESPIRATORY_TRACT | 5 refills | Status: DC
Start: 2023-05-06 — End: 2023-09-05

## 2023-05-06 NOTE — Addendum Note (Signed)
Addended by: Alphonzo Cruise on: 05/06/2023 02:47 PM   Modules accepted: Orders

## 2023-05-06 NOTE — Telephone Encounter (Signed)
I could only find the Fluticasone Salmeterol 250-50 as a powder inhaler.  Therefore, I will send in the Breo 200.  Called and informed patient of change in medication.  Will send in ERX to [facility].

## 2023-05-16 ENCOUNTER — Ambulatory Visit
Admission: RE | Admit: 2023-05-16 | Discharge: 2023-05-16 | Disposition: A | Discharge status: Home / Self Care | Payer: No Typology Code available for payment source | Source: Ambulatory Visit | Attending: Internal Medicine | Admitting: Internal Medicine

## 2023-05-16 ENCOUNTER — Encounter: Payer: Self-pay | Admitting: Radiology

## 2023-05-16 DIAGNOSIS — Z0000 Encounter for general adult medical examination without abnormal findings: Secondary | ICD-10-CM

## 2023-06-30 ENCOUNTER — Other Ambulatory Visit: Payer: Self-pay | Admitting: Allergy

## 2023-07-01 ENCOUNTER — Other Ambulatory Visit: Payer: Self-pay | Admitting: *Deleted

## 2023-07-01 MED ORDER — CYPROHEPTADINE HCL 4 MG PO TABS: ORAL_TABLET | ORAL | 0 refills | Status: DC

## 2023-07-21 ENCOUNTER — Other Ambulatory Visit: Payer: Self-pay | Admitting: Allergy

## 2023-08-27 ENCOUNTER — Ambulatory Visit
Admission: RE | Admit: 2023-08-27 | Discharge: 2023-08-27 | Disposition: A | Discharge status: Home / Self Care | Payer: No Typology Code available for payment source | Source: Ambulatory Visit | Attending: Physical Medicine & Rehabilitation | Admitting: Physical Medicine & Rehabilitation

## 2023-08-27 ENCOUNTER — Other Ambulatory Visit: Payer: Self-pay | Admitting: Physical Medicine & Rehabilitation

## 2023-08-27 DIAGNOSIS — M5451 Vertebrogenic low back pain: Secondary | ICD-10-CM

## 2023-09-05 ENCOUNTER — Encounter: Payer: Self-pay | Admitting: Allergy and Immunology

## 2023-09-05 ENCOUNTER — Ambulatory Visit: Payer: No Typology Code available for payment source | Admitting: Allergy and Immunology

## 2023-09-05 VITALS — BP 134/82 | HR 84 | Resp 18 | Ht 64.5 in | Wt 225.4 lb

## 2023-09-05 DIAGNOSIS — J3089 Other allergic rhinitis: Secondary | ICD-10-CM

## 2023-09-05 DIAGNOSIS — J4540 Moderate persistent asthma, uncomplicated: Secondary | ICD-10-CM

## 2023-09-05 DIAGNOSIS — G43909 Migraine, unspecified, not intractable, without status migrainosus: Secondary | ICD-10-CM | POA: Diagnosis not present

## 2023-09-05 MED ORDER — AIRSUPRA 90-80 MCG/ACT IN AERO: 2.0000 | INHALATION_SPRAY | RESPIRATORY_TRACT | 1 refills | Status: DC | PRN

## 2023-09-05 MED ORDER — DULERA 200-5 MCG/ACT IN AERO: INHALATION_SPRAY | RESPIRATORY_TRACT | 5 refills | Status: DC

## 2023-09-05 NOTE — Patient Instructions (Addendum)
  1. Treat and prevent inflammation of airway:   A. Dulera 200 - 2 inhalations 1-2 times per day w/spacer  B. Mometasone - 1-2 sprays each nostril 1 time per day  C. Montelukast 10 mg - 1 tablet 1 time per day  2. Treat and prevent migraine headache:   A. Periactin 4 mg - 1+1/2 tablet at bedtime  3. Use OTC ear wash kit to remove wax right ear  4. If needed:   A. AIRSUPRA - 2 inhalations every 6 hours  B. OTC antihistamine  5. Plan for fall flu vaccine  6. Return to clinic in 6 months or earlier if problem

## 2023-09-05 NOTE — Progress Notes (Signed)
[place] - [place] - [place] - [place] - [place]   Follow-up Note  Referring Provider: Georgianne Fick, MD Primary Provider: Georgianne Fick, MD Date of Office Visit: 09/05/2023  Subjective:   Susan Castillo (DOB: 1961-11-06) is a 62 y.o. female [facility] in re-evaluation of the following:  HPI: [name] returns to this clinic in evaluation of asthma, allergic rhinitis, migraine.  I have not seen her in this clinic since 08 July 2017 and she has last seen Dr. Delorse Lek 25 September 2022.  She has done pretty well with her asthma and rarely uses a short acting bronchodilator but she has had a problem with her powdered controller inhaler.  She was using Symbicort and was doing wonderful but her insurance company discontinued the use of this medicine and converted over to [place].  She now develops an irritated mouth and dryness in her mouth when using this medication.  She has had very little issues with her upper airway.  She continues to use montelukast and a nasal steroid.  She has been having a problem with her right ear feeling as though it stopped up.  She did have new hearing aids placed about 2 months ago and she had her right hearing aid tested and everything checks out okay.  Her headaches remain under excellent control while using Periactin daily.  Last year she did obtain the RSV vaccine.  Allergies as of 09/05/2023       Reactions   Codeine Nausea And Vomiting        Medication List    albuterol 108 (90 Base) MCG/ACT inhaler Commonly known as: VENTOLIN HFA INHALE 2 PUFFS BY MOUTH EVERY 4-6 HOURS AS NEEDED   ARIPiprazole 5 MG tablet Commonly known as: ABILIFY Take 1 tablet (5 mg total) by mouth daily.   Breo Ellipta 200-25 MCG/ACT Aepb Generic drug: fluticasone furoate-vilanterol Inhale one dose once daily to prevent cough or wheeze.  Rinse, gargle, and spit after use.   CALCIUM + D PO Take by  mouth.   clobetasol ointment 0.05 % Commonly known as: TEMOVATE SMARTSIG:Topical 2-3 Times Weekly   cyproheptadine 4 MG tablet Commonly known as: PERIACTIN TAKE 1 & 1/2 TABLETS BY MOUTH EVERY DAYS AT BEDTIME   diclofenac 75 MG EC tablet Commonly known as: VOLTAREN Take 75 mg by mouth 2 (two) times daily as needed.   DULoxetine 60 MG capsule Commonly known as: CYMBALTA Take 1 capsule (60 mg total) by mouth every morning.   DULoxetine 30 MG capsule Commonly known as: CYMBALTA Take 1 capsule (30 mg total) by mouth daily.   hydroxychloroquine 200 MG tablet Commonly known as: PLAQUENIL Take 400 mg by mouth daily.   ketoconazole 2 % shampoo Commonly known as: NIZORAL USE 2 TO 3 TIMES PER WEEK UTD   latanoprost 0.005 % ophthalmic solution Commonly known as: XALATAN INT 1 GTT IN OU Q NIGHT UTD   LORazepam 0.5 MG tablet Commonly known as: ATIVAN TAKE 1/2-2 TABLETS EVERY 6 HOURS AS NEEDED FOR ANXIETY AND PANIC   mometasone 50 MCG/ACT nasal spray Commonly known as: NASONEX Use one spray in each nostril once or twice daily   montelukast 10 MG tablet Commonly known as: SINGULAIR TAKE 1 TABLET ONCE AT BEDTIME   MULTIVITAMIN ADULT PO Take by mouth.   predniSONE 1 MG tablet Commonly known as: DELTASONE Take 3 mg by mouth daily.   rosuvastatin 20 MG tablet Commonly known as: CRESTOR   Simponi  Aria 50 MG/4ML Soln injection Generic drug: golimumab Inject 50 mg into the vein every 8 (eight) weeks.   traZODone 50 MG tablet Commonly known as: DESYREL Take 1 tablet (50 mg total) by mouth at bedtime.    Past Medical History:  Diagnosis Date   Allergic rhinitis    Asthma    Glaucoma    Migraines    RA (rheumatoid arthritis) (HCC)     Past Surgical History:  Procedure Laterality Date   APPENDECTOMY     GASTRIC BYPASS  2010   TONSILLECTOMY      Review of systems negative except as noted in HPI / PMHx or noted below:  Review of Systems  Constitutional:  Negative.   HENT: Negative.    Eyes: Negative.   Respiratory: Negative.    Cardiovascular: Negative.   Gastrointestinal: Negative.   Genitourinary: Negative.   Musculoskeletal: Negative.   Skin: Negative.   Neurological: Negative.   Endo/Heme/Allergies: Negative.   Psychiatric/Behavioral: Negative.       Objective:   Vitals:   09/05/23 0826  BP: 134/82  Pulse: 84  Resp: 18  SpO2: 98%   Height: 5' 4.5" (163.8 cm)  Weight: 225 lb 6.4 oz (102.2 kg)   Physical Exam Constitutional:      Appearance: She is not diaphoretic.  HENT:     Head: Normocephalic.     Right Ear: Tympanic membrane, ear canal and external ear normal. Tympanic membrane has normal mobility.     Left Ear: Tympanic membrane, ear canal and external ear normal.     Ears:     Comments: Wax right ear canal    Nose: Nose normal. No mucosal edema or rhinorrhea.     Mouth/Throat:     Pharynx: Uvula midline. No oropharyngeal exudate.  Eyes:     Conjunctiva/sclera: Conjunctivae normal.  Neck:     Thyroid: No thyromegaly.     Trachea: Trachea normal. No tracheal tenderness or tracheal deviation.  Cardiovascular:     Rate and Rhythm: Normal rate and regular rhythm.     Heart sounds: Normal heart sounds, S1 normal and S2 normal. No murmur heard. Pulmonary:     Effort: No respiratory distress.     Breath sounds: Normal breath sounds. No stridor. No wheezing or rales.  Lymphadenopathy:     Head:     Right side of head: No tonsillar adenopathy.     Left side of head: No tonsillar adenopathy.     Cervical: No cervical adenopathy.  Skin:    Findings: No erythema or rash.     Nails: There is no clubbing.  Neurological:     Mental Status: She is alert.     Diagnostics: Spirometry was performed and demonstrated an FEV1 of 2.29 at 92 % of predicted.  Assessment and Plan:   1. Asthma, moderate persistent, well-controlled   2. Other allergic rhinitis   3. Migraine syndrome    1. Treat and prevent  inflammation of airway:   A. Dulera 200 - 2 inhalations 1-2 times per day w/spacer  B. Mometasone - 1-2 sprays each nostril 1 time per day  C. Montelukast 10 mg - 1 tablet 1 time per day  2. Treat and prevent migraine headache:   A. Periactin 4 mg - 1+1/2 tablet at bedtime  3. Use OTC ear wash kit to remove wax right ear  4. If needed:   A. AIRSUPRA - 2 inhalations every 6 hours  B. OTC antihistamine  5. Plan for fall  flu vaccine  6. Return to clinic in 6 months or earlier if problem  Natajah appears to be doing pretty well regarding her airway issue although she is having some irritation from the use of her Virgel Bouquet and we will see if we can have her use a nonpowdered form of inhaled controller agent as noted above.  And she can use a anti-inflammatory rescue medicine should it be required with the use of Airsupra.  She will remain on her other anti-inflammatory medicines for her airway and continue on Periactin for her headaches and assuming she does well with this plan I will see her back in this clinic in 6 months or earlier if there is a problem.  Laurette Schimke, MD Allergy / Immunology [facility]

## 2023-09-09 ENCOUNTER — Encounter: Payer: Self-pay | Admitting: Allergy and Immunology

## 2023-09-22 ENCOUNTER — Other Ambulatory Visit: Payer: Self-pay | Admitting: Allergy

## 2023-09-24 ENCOUNTER — Ambulatory Visit: Payer: BC Managed Care – PPO | Admitting: Allergy

## 2023-09-30 ENCOUNTER — Other Ambulatory Visit: Payer: Self-pay

## 2023-09-30 MED ORDER — CYPROHEPTADINE HCL 4 MG PO TABS: ORAL_TABLET | ORAL | 1 refills | Status: DC

## 2023-10-08 ENCOUNTER — Other Ambulatory Visit: Payer: Self-pay | Admitting: Allergy

## 2023-11-13 ENCOUNTER — Encounter: Payer: Self-pay | Admitting: Psychiatry

## 2023-11-14 ENCOUNTER — Other Ambulatory Visit: Payer: Self-pay | Admitting: Psychiatry

## 2023-11-14 DIAGNOSIS — F419 Anxiety disorder, unspecified: Secondary | ICD-10-CM

## 2023-12-27 ENCOUNTER — Other Ambulatory Visit: Payer: Self-pay | Admitting: Allergy and Immunology

## 2023-12-30 NOTE — Telephone Encounter (Signed)
Dulera not covered breo is preferred advise to change

## 2024-01-06 ENCOUNTER — Other Ambulatory Visit: Payer: Self-pay | Admitting: Allergy and Immunology

## 2024-02-03 DIAGNOSIS — M0579 Rheumatoid arthritis with rheumatoid factor of multiple sites without organ or systems involvement: Secondary | ICD-10-CM | POA: Diagnosis not present

## 2024-02-13 ENCOUNTER — Other Ambulatory Visit: Payer: Self-pay | Admitting: Allergy and Immunology

## 2024-02-13 ENCOUNTER — Other Ambulatory Visit: Payer: Self-pay | Admitting: *Deleted

## 2024-02-13 MED ORDER — FLUTICASONE-SALMETEROL 250-50 MCG/ACT IN AEPB: 1.0000 | INHALATION_SPRAY | Freq: Two times a day (BID) | RESPIRATORY_TRACT | 1 refills | Status: DC

## 2024-02-26 ENCOUNTER — Other Ambulatory Visit: Payer: Self-pay

## 2024-02-26 DIAGNOSIS — F419 Anxiety disorder, unspecified: Secondary | ICD-10-CM

## 2024-02-27 MED ORDER — LORAZEPAM 0.5 MG PO TABS: ORAL_TABLET | ORAL | 0 refills | Status: DC

## 2024-03-02 ENCOUNTER — Encounter: Payer: Self-pay | Admitting: Allergy and Immunology

## 2024-03-02 ENCOUNTER — Ambulatory Visit (INDEPENDENT_AMBULATORY_CARE_PROVIDER_SITE_OTHER): Payer: No Typology Code available for payment source | Admitting: Allergy and Immunology

## 2024-03-02 VITALS — BP 118/76 | HR 85 | Resp 16 | Ht 64.5 in | Wt 218.4 lb

## 2024-03-02 DIAGNOSIS — J3089 Other allergic rhinitis: Secondary | ICD-10-CM | POA: Diagnosis not present

## 2024-03-02 DIAGNOSIS — J4540 Moderate persistent asthma, uncomplicated: Secondary | ICD-10-CM | POA: Diagnosis not present

## 2024-03-02 DIAGNOSIS — G43909 Migraine, unspecified, not intractable, without status migrainosus: Secondary | ICD-10-CM | POA: Diagnosis not present

## 2024-03-02 MED ORDER — FLUTICASONE-SALMETEROL 250-50 MCG/ACT IN AEPB: 1.0000 | INHALATION_SPRAY | Freq: Two times a day (BID) | RESPIRATORY_TRACT | 5 refills | Status: DC

## 2024-03-02 MED ORDER — MOMETASONE FUROATE 50 MCG/ACT NA SUSP: NASAL | 2 refills | Status: DC

## 2024-03-02 MED ORDER — CYPROHEPTADINE HCL 4 MG PO TABS: ORAL_TABLET | ORAL | 5 refills | Status: DC

## 2024-03-02 MED ORDER — MONTELUKAST SODIUM 10 MG PO TABS: ORAL_TABLET | ORAL | 1 refills | Status: DC

## 2024-03-02 NOTE — Patient Instructions (Addendum)
  1. Treat and prevent inflammation of airway:   A. Wixela 250 - 1 inhalations 1-2 times per day w/spacer  B. Mometasone - 1-2 sprays each nostril 1 time per day  C. Montelukast 10 mg - 1 tablet 1 time per day  2. Treat and prevent migraine headache:   A. Periactin 4 mg - 1+1/2 tablet at bedtime  3. If needed:   A. AIRSUPRA - 2 inhalations every 6 hours  B. OTC antihistamine  4. Influenza = Tamiflu. Covid = Paxlovid  5. Return to clinic in 6 months or earlier if problem

## 2024-03-02 NOTE — Progress Notes (Unsigned)
 [place] - [place] - [place] - [place] - [place]   Follow-up Note  Referring Provider: Georgianne Fick, MD Primary Provider: Georgianne Fick, MD Date of Office Visit: 03/02/2024  Subjective:   Susan Castillo (DOB: December 10, 1961) is a 63 y.o. female who [facility] in re-evaluation of the following:  HPI: Susan Castillo returns to this clinic in evaluation of asthma, allergic rhinitis, migraine.  I last saw her in this clinic 05 September 2023.  She has really done very well with her asthma and has not required a systemic steroid to treat an exacerbation and has no limitation on ability to exercise or have cold air exposure while currently using a controller agent twice a day.  Rarely does she use her rescue medicine.  She has had very little problems with her nose where she continues on montelukast and a nasal steroid.  Her headache is under excellent control using Periactin.  Allergies as of 03/02/2024       Reactions   Codeine Nausea And Vomiting        Medication List    Airsupra 90-80 MCG/ACT Aero Generic drug: Albuterol-Budesonide Inhale 2 puffs into the lungs as needed (every 6 hours for cough, wheeze, shortness of breath.  Rinse, gargle, and spit after use).   alendronate 70 MG tablet Commonly known as: FOSAMAX Take 70 mg by mouth once a week.   ARIPiprazole 5 MG tablet Commonly known as: ABILIFY Take 1 tablet (5 mg total) by mouth daily.   CALCIUM + D PO Take by mouth.   clobetasol ointment 0.05 % Commonly known as: TEMOVATE SMARTSIG:Topical 2-3 Times Weekly   cyproheptadine 4 MG tablet Commonly known as: PERIACTIN TAKE 1 & 1/2 TABLETS BY MOUTH EVERY DAY AT BEDTIME   diclofenac 75 MG EC tablet Commonly known as: VOLTAREN Take 75 mg by mouth 2 (two) times daily as needed.   DULoxetine 60 MG capsule Commonly known as: CYMBALTA Take 1 capsule (60 mg total) by mouth every morning.   DULoxetine 30  MG capsule Commonly known as: CYMBALTA Take 1 capsule (30 mg total) by mouth daily.   fluticasone-salmeterol 250-50 MCG/ACT Aepb Commonly known as: Wixela Inhub Inhale 1 puff into the lungs in the morning and at bedtime.   hydroxychloroquine 200 MG tablet Commonly known as: PLAQUENIL Take 400 mg by mouth daily.   ketoconazole 2 % shampoo Commonly known as: NIZORAL USE 2 TO 3 TIMES PER WEEK UTD   latanoprost 0.005 % ophthalmic solution Commonly known as: XALATAN INT 1 GTT IN OU Q NIGHT UTD   LORazepam 0.5 MG tablet Commonly known as: ATIVAN TAKE 1/2-2 TABLETS EVERY 6 HOURS AS NEEDED FOR ANXIETY AND PANIC   metFORMIN 500 MG 24 hr tablet Commonly known as: GLUCOPHAGE-XR SMARTSIG:1 Tablet(s) By Mouth Every Evening   mometasone 50 MCG/ACT nasal spray Commonly known as: NASONEX Use one spray in each nostril once or twice daily   montelukast 10 MG tablet Commonly known as: SINGULAIR TAKE 1 TABLET BY MOUTH EVERYDAY AT BEDTIME   MULTIVITAMIN ADULT PO Take by mouth.   predniSONE 1 MG tablet Commonly known as: DELTASONE Take 3 mg by mouth daily.   rosuvastatin 20 MG tablet Commonly known as: CRESTOR   Simponi Aria 50 MG/4ML Soln injection Generic drug: golimumab Inject 50 mg into the vein every 8 (eight) weeks.   traZODone 50 MG tablet Commonly known as: DESYREL Take 1 tablet (50 mg total) by mouth at bedtime.  Past Medical History:  Diagnosis Date   Allergic rhinitis    Asthma    Glaucoma    Migraines    RA (rheumatoid arthritis) (HCC)     Past Surgical History:  Procedure Laterality Date   APPENDECTOMY     GASTRIC BYPASS  2010   TONSILLECTOMY      Review of systems negative except as noted in HPI / PMHx or noted below:  Review of Systems  Constitutional: Negative.   HENT: Negative.    Eyes: Negative.   Respiratory: Negative.    Cardiovascular: Negative.   Gastrointestinal: Negative.   Genitourinary: Negative.   Musculoskeletal: Negative.    Skin: Negative.   Neurological: Negative.   Endo/Heme/Allergies: Negative.   Psychiatric/Behavioral: Negative.       Objective:   Vitals:   03/02/24 1614  BP: 118/76  Pulse: 85  Resp: 16  SpO2: 97%   Height: 5' 4.5" (163.8 cm)  Weight: 218 lb 6.4 oz (99.1 kg)   Physical Exam Constitutional:      Appearance: She is not diaphoretic.  HENT:     Head: Normocephalic.     Right Ear: Tympanic membrane, ear canal and external ear normal.     Left Ear: Tympanic membrane, ear canal and external ear normal.     Nose: Nose normal. No mucosal edema or rhinorrhea.     Mouth/Throat:     Pharynx: Uvula midline. No oropharyngeal exudate.  Eyes:     Conjunctiva/sclera: Conjunctivae normal.  Neck:     Thyroid: No thyromegaly.     Trachea: Trachea normal. No tracheal tenderness or tracheal deviation.  Cardiovascular:     Rate and Rhythm: Normal rate and regular rhythm.     Heart sounds: Normal heart sounds, S1 normal and S2 normal. No murmur heard. Pulmonary:     Effort: No respiratory distress.     Breath sounds: Normal breath sounds. No stridor. No wheezing or rales.  Lymphadenopathy:     Head:     Right side of head: No tonsillar adenopathy.     Left side of head: No tonsillar adenopathy.     Cervical: No cervical adenopathy.  Skin:    Findings: No erythema or rash.     Nails: There is no clubbing.  Neurological:     Mental Status: She is alert.     Diagnostics: Spirometry was performed and demonstrated an FEV1 of 2.35 at 94 % of predicted.  Assessment and Plan:   1. Asthma, moderate persistent, well-controlled   2. Other allergic rhinitis   3. Migraine syndrome     1. Treat and prevent inflammation of airway:   A. Wixela 250 - 1 inhalations 1-2 times per day w/spacer  B. Mometasone - 1-2 sprays each nostril 1 time per day  C. Montelukast 10 mg - 1 tablet 1 time per day  2. Treat and prevent migraine headache:   A. Periactin 4 mg - 1+1/2 tablet at bedtime  3.  If needed:   A. AIRSUPRA - 2 inhalations every 6 hours  B. OTC antihistamine  4. Influenza = Tamiflu. Covid = Paxlovid  5. Return to clinic in 6 months or earlier if problem  Susan Castillo appears to be doing very well while utilizing a collection of anti-inflammatory agents for both her upper and lower airway and also continuing to address migraine with preventative cyproheptadine.  Will keep her on this plan and see her back in this clinic in 6 months or earlier if there is a problem.  Susan Schimke, MD Allergy / Immunology [facility]

## 2024-03-03 ENCOUNTER — Encounter: Payer: Self-pay | Admitting: Allergy and Immunology

## 2024-03-04 ENCOUNTER — Ambulatory Visit: Payer: No Typology Code available for payment source | Admitting: Allergy and Immunology

## 2024-03-30 DIAGNOSIS — M0579 Rheumatoid arthritis with rheumatoid factor of multiple sites without organ or systems involvement: Secondary | ICD-10-CM | POA: Diagnosis not present

## 2024-04-07 DIAGNOSIS — M79643 Pain in unspecified hand: Secondary | ICD-10-CM | POA: Diagnosis not present

## 2024-04-07 DIAGNOSIS — M25562 Pain in left knee: Secondary | ICD-10-CM | POA: Diagnosis not present

## 2024-04-07 DIAGNOSIS — M0579 Rheumatoid arthritis with rheumatoid factor of multiple sites without organ or systems involvement: Secondary | ICD-10-CM | POA: Diagnosis not present

## 2024-04-07 DIAGNOSIS — M1990 Unspecified osteoarthritis, unspecified site: Secondary | ICD-10-CM | POA: Diagnosis not present

## 2024-04-07 DIAGNOSIS — Z79899 Other long term (current) drug therapy: Secondary | ICD-10-CM | POA: Diagnosis not present

## 2024-04-13 ENCOUNTER — Other Ambulatory Visit: Payer: Self-pay | Admitting: Internal Medicine

## 2024-04-13 DIAGNOSIS — Z0000 Encounter for general adult medical examination without abnormal findings: Secondary | ICD-10-CM

## 2024-04-16 ENCOUNTER — Encounter: Payer: Self-pay | Admitting: Physician Assistant

## 2024-04-16 ENCOUNTER — Ambulatory Visit: Payer: BC Managed Care – PPO | Admitting: Psychiatry

## 2024-04-16 ENCOUNTER — Ambulatory Visit (INDEPENDENT_AMBULATORY_CARE_PROVIDER_SITE_OTHER): Payer: BC Managed Care – PPO | Admitting: Physician Assistant

## 2024-04-16 DIAGNOSIS — F419 Anxiety disorder, unspecified: Secondary | ICD-10-CM | POA: Diagnosis not present

## 2024-04-16 DIAGNOSIS — F3342 Major depressive disorder, recurrent, in full remission: Secondary | ICD-10-CM | POA: Diagnosis not present

## 2024-04-16 DIAGNOSIS — F5101 Primary insomnia: Secondary | ICD-10-CM

## 2024-04-16 MED ORDER — DULOXETINE HCL 30 MG PO CPEP
30.0000 mg | ORAL_CAPSULE | Freq: Every day | ORAL | 3 refills | Status: AC
Start: 2024-04-16 — End: ?

## 2024-04-16 MED ORDER — TRAZODONE HCL 50 MG PO TABS: 50.0000 mg | ORAL_TABLET | Freq: Every day | ORAL | 3 refills | Status: AC

## 2024-04-16 MED ORDER — LORAZEPAM 0.5 MG PO TABS: ORAL_TABLET | ORAL | 5 refills | Status: DC

## 2024-04-16 MED ORDER — ARIPIPRAZOLE 5 MG PO TABS: 5.0000 mg | ORAL_TABLET | Freq: Every day | ORAL | 3 refills | Status: AC

## 2024-04-16 MED ORDER — DULOXETINE HCL 60 MG PO CPEP: 60.0000 mg | ORAL_CAPSULE | Freq: Every morning | ORAL | 3 refills | Status: AC

## 2024-04-16 NOTE — Progress Notes (Signed)
 [facility]  Patient ID: Susan Castillo,  MRN: [id number]  PCP: Susan Grime, MD  Date of Evaluation: 04/16/2024 Time spent:20 minutes  Chief Complaint:  Chief Complaint   Anxiety; Depression; Insomnia; Follow-up    HISTORY/CURRENT STATUS: HPI Former pt of Susan Connor, NP who is no longer with the practice.   Doing really well with mood. Patient is able to enjoy things.  Energy and motivation are good.  Work is going well.   No extreme sadness, tearfulness, or feelings of hopelessness.  Sleeps well most of the time. ADLs and personal hygiene are normal.   Denies any changes in concentration, making decisions, or remembering things.  Appetite has not changed.  Weight is stable.  Anxiety is controlled.  He did need to use the Ativan a little more often last month when both her mother-in-law and father-in-law died within a week of each other.  Denies suicidal or homicidal thoughts.  Patient denies increased energy with decreased need for sleep, increased talkativeness, racing thoughts, impulsivity or risky behaviors, increased spending, increased libido, grandiosity, increased irritability or anger, paranoia, or hallucinations.  Denies dizziness, syncope, seizures, numbness, tingling, tremor, tics, unsteady gait, slurred speech, confusion.  She has chronic joint pain due to RA and osteoarthritis.  It is mostly controlled, although she has tried a lower dose of prednisone recently and she is feeling more pain lately.  Denies dystonia.  Individual Medical History/ Review of Systems: Changes? :No   Past Medication Trials: Cymbalta-effective Concerta Abilify Trazodone Rexulti Lorazepam  Allergies: Codeine  Current Medications:  Current Outpatient Medications:    Albuterol-Budesonide (AIRSUPRA) 90-80 MCG/ACT AERO, Inhale 2 puffs into the lungs as needed (every 6 hours for cough, wheeze, shortness of breath.  Rinse, gargle, and spit after use)., Disp: 10.7 g,  Rfl: 1   alendronate (FOSAMAX) 70 MG tablet, Take 70 mg by mouth once a week., Disp: , Rfl:    Calcium Citrate-Vitamin D (CALCIUM + D PO), Take by mouth., Disp: , Rfl:    clobetasol ointment (TEMOVATE) 0.05 %, SMARTSIG:Topical 2-3 Times Weekly, Disp: , Rfl:    cyproheptadine (PERIACTIN) 4 MG tablet, TAKE 1 & 1/2 TABLETS BY MOUTH EVERY DAY AT BEDTIME, Disp: 30 tablet, Rfl: 5   fluticasone-salmeterol (WIXELA INHUB) 250-50 MCG/ACT AEPB, Inhale 1 puff into the lungs in the morning and at bedtime., Disp: 60 each, Rfl: 5   golimumab (SIMPONI ARIA) 50 MG/4ML SOLN injection, Inject 50 mg into the vein every 8 (eight) weeks., Disp: , Rfl:    hydroxychloroquine (PLAQUENIL) 200 MG tablet, Take 400 mg by mouth daily., Disp: , Rfl:    ketoconazole (NIZORAL) 2 % shampoo, USE 2 TO 3 TIMES PER WEEK UTD, Disp: , Rfl: 3   latanoprost (XALATAN) 0.005 % ophthalmic solution, INT 1 GTT IN OU Q NIGHT UTD, Disp: , Rfl: 11   metFORMIN (GLUCOPHAGE-XR) 500 MG 24 hr tablet, SMARTSIG:1 Tablet(s) By Mouth Every Evening, Disp: , Rfl:    mometasone (NASONEX) 50 MCG/ACT nasal spray, Use one spray in each nostril once or twice daily, Disp: 51 each, Rfl: 2   montelukast (SINGULAIR) 10 MG tablet, TAKE 1 TABLET BY MOUTH EVERYDAY AT BEDTIME, Disp: 90 tablet, Rfl: 1   Multiple Vitamins-Minerals (MULTIVITAMIN ADULT PO), Take by mouth., Disp: , Rfl:    predniSONE (DELTASONE) 1 MG tablet, Take 2 mg by mouth daily., Disp: , Rfl:    rosuvastatin (CRESTOR) 20 MG tablet, , Disp: , Rfl:    ARIPiprazole (ABILIFY) 5 MG tablet, Take 1  tablet (5 mg total) by mouth daily., Disp: 90 tablet, Rfl: 3   diclofenac (VOLTAREN) 75 MG EC tablet, Take 75 mg by mouth 2 (two) times daily as needed. (Patient not taking: Reported on 04/16/2024), Disp: , Rfl:    DULoxetine (CYMBALTA) 30 MG capsule, Take 1 capsule (30 mg total) by mouth daily., Disp: 90 capsule, Rfl: 3   DULoxetine (CYMBALTA) 60 MG capsule, Take 1 capsule (60 mg total) by mouth every morning.,  Disp: 90 capsule, Rfl: 3   LORazepam (ATIVAN) 0.5 MG tablet, TAKE 1/2-2 TABLETS EVERY 6 HOURS AS NEEDED FOR ANXIETY AND PANIC, Disp: 90 tablet, Rfl: 5   traZODone (DESYREL) 50 MG tablet, Take 1 tablet (50 mg total) by mouth at bedtime., Disp: 90 tablet, Rfl: 3 Medication Side Effects: none  Family Medical/ Social History: Changes? Yes   Both mother-in law and father-in-law died in April 01, 2024.  Both had declining health for years.   MENTAL HEALTH EXAM:  There were no vitals taken for this visit.There is no height or weight on file to calculate BMI.  General Appearance: Casual and Well Groomed  Eye Contact:  Good  Speech:  Clear and Coherent and Normal Rate  Volume:  Normal  Mood:  Euthymic  Affect:  Congruent  Thought Process:  Goal Directed and Descriptions of Associations: Circumstantial  Orientation:  Full (Time, Place, and Person)  Thought Content: Logical   Suicidal Thoughts:  No  Homicidal Thoughts:  No  Memory:  WNL  Judgement:  Good  Insight:  Good  Psychomotor Activity:  Normal  Concentration:  Concentration: Good  Recall:  Good  Fund of Knowledge: Good  Language: Good  Assets:  Desire for Improvement Financial Resources/Insurance Housing Transportation Vocational/Educational  ADL's:  Intact  Cognition: WNL  Prognosis:  Good   Her PCP and rheumatologist follow her labs.  DIAGNOSES:    ICD-10-CM   1. Major depression, recurrent, full remission (HCC)  F33.42 ARIPiprazole (ABILIFY) 5 MG tablet    DULoxetine (CYMBALTA) 60 MG capsule    DULoxetine (CYMBALTA) 30 MG capsule    2. Anxiety disorder, unspecified type  F41.9 LORazepam (ATIVAN) 0.5 MG tablet    3. Primary insomnia  F51.01 traZODone (DESYREL) 50 MG tablet     Receiving Psychotherapy: No   RECOMMENDATIONS:   PDMP reviewed.  Ativan filled 02/27/2024. I provided 20 minutes of face to face time during this encounter, including time spent before and after the visit in records review, medical decision making,  counseling pertinent to today's visit, and charting.   Susan Castillo is doing well on her current treatment regimen so no changes will be made.  Continue Abilify 5 mg, 1 p.o. daily. Continue Cymbalta 30 mg +60 mg daily. Continue Ativan 0.5 mg, 1/2-2 p.o. every 6 hours as needed anxiety or panic. Continue trazodone 50 mg, 1 p.o. nightly. Return in 9 months.  Marvia Slocumb, PA-C

## 2024-04-24 DIAGNOSIS — H401132 Primary open-angle glaucoma, bilateral, moderate stage: Secondary | ICD-10-CM | POA: Diagnosis not present

## 2024-05-11 ENCOUNTER — Encounter (HOSPITAL_COMMUNITY): Payer: Self-pay

## 2024-05-12 ENCOUNTER — Encounter

## 2024-05-12 DIAGNOSIS — Z1231 Encounter for screening mammogram for malignant neoplasm of breast: Secondary | ICD-10-CM

## 2024-05-18 ENCOUNTER — Ambulatory Visit

## 2024-05-26 DIAGNOSIS — M0579 Rheumatoid arthritis with rheumatoid factor of multiple sites without organ or systems involvement: Secondary | ICD-10-CM | POA: Diagnosis not present

## 2024-06-09 ENCOUNTER — Ambulatory Visit
Admission: RE | Admit: 2024-06-09 | Discharge: 2024-06-09 | Disposition: A | Discharge status: Home / Self Care | Source: Ambulatory Visit | Attending: Internal Medicine | Admitting: Internal Medicine

## 2024-06-09 DIAGNOSIS — Z0000 Encounter for general adult medical examination without abnormal findings: Secondary | ICD-10-CM

## 2024-06-09 DIAGNOSIS — Z1231 Encounter for screening mammogram for malignant neoplasm of breast: Secondary | ICD-10-CM | POA: Diagnosis not present

## 2024-07-01 ENCOUNTER — Other Ambulatory Visit: Payer: Self-pay | Admitting: Allergy and Immunology

## 2024-07-06 ENCOUNTER — Telehealth: Payer: Self-pay

## 2024-07-06 ENCOUNTER — Other Ambulatory Visit (HOSPITAL_COMMUNITY): Payer: Self-pay

## 2024-07-06 NOTE — Telephone Encounter (Signed)
*  [facility]  Pharmacy Patient Advocate Encounter   Received notification from RX Request Messages that prior authorization for Nasonex /Mometasone  Nasal Spray is required/requested.   Insurance verification completed.   The patient is insured through [employer]: [phone]) .   Called patient pharmacy to receive patient pharmacy benefits as there is none on the insurance card on file  Processed information for a test claim for Nasonex  and claim went through  [facility] patient pharmacy back and they re-processed claim and it also went through on their end-pharmacy getting script ready for patient.   Nothing further needed at this time.  BIN: [id number] PCN: [id number] GROUP: [id number] ID: [id number]  #[phone]

## 2024-07-06 NOTE — Telephone Encounter (Signed)
 This could be not covered because Nasonex  is an OTC medication- I do not see a pharmacy benefits card on file (only her medical). I will try to call her pharmacy and see what they have been billing.  PA request has been Received. New Encounter has been or will be created for follow up. For additional info see Pharmacy Prior Auth telephone encounter from 07/07.

## 2024-07-15 DIAGNOSIS — N182 Chronic kidney disease, stage 2 (mild): Secondary | ICD-10-CM | POA: Diagnosis not present

## 2024-07-15 DIAGNOSIS — R5383 Other fatigue: Secondary | ICD-10-CM | POA: Diagnosis not present

## 2024-07-15 DIAGNOSIS — K760 Fatty (change of) liver, not elsewhere classified: Secondary | ICD-10-CM | POA: Diagnosis not present

## 2024-07-15 DIAGNOSIS — E782 Mixed hyperlipidemia: Secondary | ICD-10-CM | POA: Diagnosis not present

## 2024-07-15 DIAGNOSIS — R7303 Prediabetes: Secondary | ICD-10-CM | POA: Diagnosis not present

## 2024-07-16 DIAGNOSIS — H401132 Primary open-angle glaucoma, bilateral, moderate stage: Secondary | ICD-10-CM | POA: Diagnosis not present

## 2024-07-17 ENCOUNTER — Other Ambulatory Visit: Payer: Self-pay | Admitting: Allergy and Immunology

## 2024-07-21 DIAGNOSIS — M0579 Rheumatoid arthritis with rheumatoid factor of multiple sites without organ or systems involvement: Secondary | ICD-10-CM | POA: Diagnosis not present

## 2024-07-22 DIAGNOSIS — Z0000 Encounter for general adult medical examination without abnormal findings: Secondary | ICD-10-CM | POA: Diagnosis not present

## 2024-07-22 DIAGNOSIS — R7303 Prediabetes: Secondary | ICD-10-CM | POA: Diagnosis not present

## 2024-07-22 DIAGNOSIS — N182 Chronic kidney disease, stage 2 (mild): Secondary | ICD-10-CM | POA: Diagnosis not present

## 2024-07-22 DIAGNOSIS — R1319 Other dysphagia: Secondary | ICD-10-CM | POA: Diagnosis not present

## 2024-07-22 DIAGNOSIS — E782 Mixed hyperlipidemia: Secondary | ICD-10-CM | POA: Diagnosis not present

## 2024-08-14 ENCOUNTER — Other Ambulatory Visit: Payer: Self-pay | Admitting: Medical Genetics

## 2024-08-18 DIAGNOSIS — M25562 Pain in left knee: Secondary | ICD-10-CM | POA: Diagnosis not present

## 2024-08-18 DIAGNOSIS — M0579 Rheumatoid arthritis with rheumatoid factor of multiple sites without organ or systems involvement: Secondary | ICD-10-CM | POA: Diagnosis not present

## 2024-08-18 DIAGNOSIS — M79643 Pain in unspecified hand: Secondary | ICD-10-CM | POA: Diagnosis not present

## 2024-08-18 DIAGNOSIS — M1990 Unspecified osteoarthritis, unspecified site: Secondary | ICD-10-CM | POA: Diagnosis not present

## 2024-08-18 DIAGNOSIS — Z79899 Other long term (current) drug therapy: Secondary | ICD-10-CM | POA: Diagnosis not present

## 2024-08-25 ENCOUNTER — Other Ambulatory Visit

## 2024-08-25 DIAGNOSIS — Z006 Encounter for examination for normal comparison and control in clinical research program: Secondary | ICD-10-CM

## 2024-08-28 DIAGNOSIS — Z79899 Other long term (current) drug therapy: Secondary | ICD-10-CM | POA: Diagnosis not present

## 2024-08-28 DIAGNOSIS — K148 Other diseases of tongue: Secondary | ICD-10-CM | POA: Diagnosis not present

## 2024-08-28 DIAGNOSIS — K140 Glossitis: Secondary | ICD-10-CM | POA: Diagnosis not present

## 2024-08-28 DIAGNOSIS — Z7984 Long term (current) use of oral hypoglycemic drugs: Secondary | ICD-10-CM | POA: Diagnosis not present

## 2024-09-03 ENCOUNTER — Encounter: Payer: Self-pay | Admitting: Allergy and Immunology

## 2024-09-03 ENCOUNTER — Ambulatory Visit (INDEPENDENT_AMBULATORY_CARE_PROVIDER_SITE_OTHER): Admitting: Allergy and Immunology

## 2024-09-03 VITALS — BP 128/76 | HR 88 | Resp 14 | Ht 63.8 in | Wt 219.4 lb

## 2024-09-03 DIAGNOSIS — J3089 Other allergic rhinitis: Secondary | ICD-10-CM | POA: Diagnosis not present

## 2024-09-03 DIAGNOSIS — G43909 Migraine, unspecified, not intractable, without status migrainosus: Secondary | ICD-10-CM | POA: Diagnosis not present

## 2024-09-03 DIAGNOSIS — J4540 Moderate persistent asthma, uncomplicated: Secondary | ICD-10-CM | POA: Diagnosis not present

## 2024-09-03 MED ORDER — MOMETASONE FUROATE 50 MCG/ACT NA SUSP: NASAL | 11 refills | Status: AC

## 2024-09-03 MED ORDER — CYPROHEPTADINE HCL 4 MG PO TABS: ORAL_TABLET | ORAL | 3 refills | Status: AC

## 2024-09-03 MED ORDER — AIRSUPRA 90-80 MCG/ACT IN AERO: 2.0000 | INHALATION_SPRAY | RESPIRATORY_TRACT | 1 refills | Status: AC | PRN

## 2024-09-03 MED ORDER — MONTELUKAST SODIUM 10 MG PO TABS: ORAL_TABLET | ORAL | 3 refills | Status: AC

## 2024-09-03 MED ORDER — FLUTICASONE-SALMETEROL 250-50 MCG/ACT IN AEPB: INHALATION_SPRAY | RESPIRATORY_TRACT | 11 refills | Status: AC

## 2024-09-03 NOTE — Progress Notes (Signed)
 [place] [facility] [place] - [place] - [place] - [place]   Follow-up Note  Referring Provider: Verdia Lombard, MD Primary Provider: Verdia Lombard, MD Date of Office Visit: 09/03/2024  Subjective:   Susan Castillo (DOB: 04-27-1961) is a 63 y.o. female who [facility] in re-evaluation of the following:  HPI: Susan Castillo returns to this clinic in evaluation of asthma, allergic rhinitis, migraine.  I last saw her in this clinic 02 March 2024.  She has had an excellent interval of time without the need for systemic steroid or an antibiotic for any type of airway issue and she rarely uses the short acting bronchodilator and can exert herself without any problem while she continues to use Wixela mostly 1 time per day.  Likewise she has had very little problems with her nose and she continues to use mometasone  nasal spray about 1 time per day along with montelukast .  Her headaches are well-controlled while using 6 mg of Periactin  at bedtime and her sleep is also going very well.  Allergies as of 09/03/2024       Reactions   Codeine Nausea And Vomiting        Medication List    Airsupra  90-80 MCG/ACT Aero Generic drug: Albuterol -Budesonide  Inhale 2 puffs into the lungs as needed (every 6 hours for cough, wheeze, shortness of breath.  Rinse, gargle, and spit after use).   alendronate 70 MG tablet Commonly known as: FOSAMAX Take 70 mg by mouth once a week.   ARIPiprazole  5 MG tablet Commonly known as: ABILIFY  Take 1 tablet (5 mg total) by mouth daily.   CALCIUM + D PO Take by mouth.   cyproheptadine  4 MG tablet Commonly known as: PERIACTIN  TAKE 1 & 1/2 TABLETS BY MOUTH EVERY DAY AT BEDTIME   diclofenac 75 MG EC tablet Commonly known as: VOLTAREN Take 75 mg by mouth 2 (two) times daily as needed.   DULoxetine  60 MG capsule Commonly known as: CYMBALTA  Take 1 capsule (60 mg total) by mouth every morning.   DULoxetine   30 MG capsule Commonly known as: CYMBALTA  Take 1 capsule (30 mg total) by mouth daily.   fluticasone -salmeterol 250-50 MCG/ACT Aepb Commonly known as: Wixela Inhub Inhale 1 puff into the lungs in the morning and at bedtime.   hydroxychloroquine 200 MG tablet Commonly known as: PLAQUENIL Take 400 mg by mouth daily.   latanoprost 0.005 % ophthalmic solution Commonly known as: XALATAN INT 1 GTT IN OU Q NIGHT UTD   LORazepam  0.5 MG tablet Commonly known as: ATIVAN  TAKE 1/2-2 TABLETS EVERY 6 HOURS AS NEEDED FOR ANXIETY AND PANIC   metFORMIN 500 MG 24 hr tablet Commonly known as: GLUCOPHAGE-XR SMARTSIG:1 Tablet(s) By Mouth Every Evening   mometasone  50 MCG/ACT nasal spray Commonly known as: NASONEX  Use one spray in each nostril once or twice daily   montelukast  10 MG tablet Commonly known as: SINGULAIR  TAKE 1 TABLET BY MOUTH EVERYDAY AT BEDTIME   MULTIVITAMIN ADULT PO Take by mouth.   predniSONE 1 MG tablet Commonly known as: DELTASONE Take 2 mg by mouth daily.   rosuvastatin 20 MG tablet Commonly known as: CRESTOR   Simponi  Aria 50 MG/4ML Soln injection Generic drug: golimumab  Inject 50 mg into the vein every 8 (eight) weeks.   Sodium Fluoride 5000 PPM 1.1 % Gel dental gel Generic drug: sodium fluoride 2 (two) times daily.   sulfaSALAzine 500 MG tablet Commonly known as: AZULFIDINE What changed: See the new instructions.  traZODone  50 MG tablet Commonly known as: DESYREL  Take 1 tablet (50 mg total) by mouth at bedtime.    Past Medical History:  Diagnosis Date   Allergic rhinitis    Asthma    Glaucoma    Migraines    RA (rheumatoid arthritis) (HCC)     Past Surgical History:  Procedure Laterality Date   APPENDECTOMY     GASTRIC BYPASS  2010   TONSILLECTOMY      Review of systems negative except as noted in HPI / PMHx or noted below:  Review of Systems  Constitutional: Negative.   HENT: Negative.    Eyes: Negative.   Respiratory: Negative.     Cardiovascular: Negative.   Gastrointestinal: Negative.   Genitourinary: Negative.   Musculoskeletal: Negative.   Skin: Negative.   Neurological: Negative.   Endo/Heme/Allergies: Negative.   Psychiatric/Behavioral: Negative.       Objective:   Vitals:   09/03/24 1628 09/03/24 1654  BP: (!) 146/76 128/76  Pulse: 88   Resp: 14   SpO2: 96%    Height: 5' 3.8 (162.1 cm)  Weight: 219 lb 6.4 oz (99.5 kg)   Physical Exam Constitutional:      Appearance: She is not diaphoretic.  HENT:     Head: Normocephalic.     Right Ear: Tympanic membrane, ear canal and external ear normal.     Left Ear: Tympanic membrane, ear canal and external ear normal.     Nose: Nose normal. No mucosal edema or rhinorrhea.     Mouth/Throat:     Pharynx: Uvula midline. No oropharyngeal exudate.  Eyes:     Conjunctiva/sclera: Conjunctivae normal.  Neck:     Thyroid : No thyromegaly.     Trachea: Trachea normal. No tracheal tenderness or tracheal deviation.  Cardiovascular:     Rate and Rhythm: Normal rate and regular rhythm.     Heart sounds: Normal heart sounds, S1 normal and S2 normal. No murmur heard. Pulmonary:     Effort: No respiratory distress.     Breath sounds: Normal breath sounds. No stridor. No wheezing or rales.  Lymphadenopathy:     Head:     Right side of head: No tonsillar adenopathy.     Left side of head: No tonsillar adenopathy.     Cervical: No cervical adenopathy.  Skin:    Findings: No erythema or rash.     Nails: There is no clubbing.  Neurological:     Mental Status: She is alert.     Diagnostics: Spirometry was performed and demonstrated an FEV1 of 2.58 at 107 % of predicted.  Assessment and Plan:   1. Asthma, moderate persistent, well-controlled   2. Other allergic rhinitis   3. Migraine syndrome    1. Treat and prevent inflammation of airway:   A. Wixela 250 - 1 inhalations 1-2 times per day   B. Mometasone  - 1-2 sprays each nostril 1 time per day  C.  Montelukast  10 mg - 1 tablet 1 time per day  2. Treat and prevent migraine headache:   A. Periactin  4 mg - 1+1/2 tablet at bedtime  3. If needed:   A. AIRSUPRA  - 2 inhalations every 6 hours  B. OTC antihistamine  4. Influenza = Tamiflu. Covid = Paxlovid  5. Return to clinic in 12 months or earlier if problem  Rasheena is really doing very well on her current plan of using anti-inflammatory agents for both her upper and lower airway and we will refill her medications  and see her back in this clinic in 1 year or earlier if there is a problem.  Camellia Denis, MD Allergy / Immunology [facility]

## 2024-09-03 NOTE — Patient Instructions (Addendum)
  1. Treat and prevent inflammation of airway:   A. Wixela 250 - 1 inhalations 1-2 times per day   B. Mometasone  - 1-2 sprays each nostril 1 time per day  C. Montelukast  10 mg - 1 tablet 1 time per day  2. Treat and prevent migraine headache:   A. Periactin  4 mg - 1+1/2 tablet at bedtime  3. If needed:   A. AIRSUPRA  - 2 inhalations every 6 hours  B. OTC antihistamine  4. Influenza = Tamiflu. Covid = Paxlovid  5. Return to clinic in 12 months or earlier if problem

## 2024-09-04 LAB — GENECONNECT MOLECULAR SCREEN: Genetic Analysis Overall Interpretation: NEGATIVE

## 2024-09-07 ENCOUNTER — Encounter: Payer: Self-pay | Admitting: Allergy and Immunology

## 2024-09-15 DIAGNOSIS — M0579 Rheumatoid arthritis with rheumatoid factor of multiple sites without organ or systems involvement: Secondary | ICD-10-CM | POA: Diagnosis not present

## 2024-09-22 DIAGNOSIS — D0439 Carcinoma in situ of skin of other parts of face: Secondary | ICD-10-CM | POA: Diagnosis not present

## 2024-09-29 DIAGNOSIS — K219 Gastro-esophageal reflux disease without esophagitis: Secondary | ICD-10-CM | POA: Diagnosis not present

## 2024-10-14 ENCOUNTER — Other Ambulatory Visit: Payer: Self-pay | Admitting: Physician Assistant

## 2024-10-14 DIAGNOSIS — F419 Anxiety disorder, unspecified: Secondary | ICD-10-CM

## 2024-10-16 DIAGNOSIS — H401132 Primary open-angle glaucoma, bilateral, moderate stage: Secondary | ICD-10-CM | POA: Diagnosis not present

## 2024-10-27 DIAGNOSIS — Z9884 Bariatric surgery status: Secondary | ICD-10-CM | POA: Diagnosis not present

## 2024-10-27 DIAGNOSIS — K219 Gastro-esophageal reflux disease without esophagitis: Secondary | ICD-10-CM | POA: Diagnosis not present

## 2024-10-27 DIAGNOSIS — Z885 Allergy status to narcotic agent status: Secondary | ICD-10-CM | POA: Diagnosis not present

## 2024-10-27 DIAGNOSIS — K222 Esophageal obstruction: Secondary | ICD-10-CM | POA: Diagnosis not present

## 2024-10-27 DIAGNOSIS — F419 Anxiety disorder, unspecified: Secondary | ICD-10-CM | POA: Diagnosis not present

## 2024-10-27 DIAGNOSIS — Z79899 Other long term (current) drug therapy: Secondary | ICD-10-CM | POA: Diagnosis not present

## 2024-10-27 DIAGNOSIS — J45909 Unspecified asthma, uncomplicated: Secondary | ICD-10-CM | POA: Diagnosis not present

## 2024-10-27 DIAGNOSIS — Z6836 Body mass index (BMI) 36.0-36.9, adult: Secondary | ICD-10-CM | POA: Diagnosis not present

## 2024-10-27 DIAGNOSIS — F32A Depression, unspecified: Secondary | ICD-10-CM | POA: Diagnosis not present

## 2024-10-27 DIAGNOSIS — R1310 Dysphagia, unspecified: Secondary | ICD-10-CM | POA: Diagnosis not present

## 2024-10-27 DIAGNOSIS — E669 Obesity, unspecified: Secondary | ICD-10-CM | POA: Diagnosis not present

## 2024-10-27 DIAGNOSIS — E785 Hyperlipidemia, unspecified: Secondary | ICD-10-CM | POA: Diagnosis not present

## 2024-11-09 DIAGNOSIS — G894 Chronic pain syndrome: Secondary | ICD-10-CM | POA: Diagnosis not present

## 2024-11-09 DIAGNOSIS — M5417 Radiculopathy, lumbosacral region: Secondary | ICD-10-CM | POA: Diagnosis not present

## 2024-11-09 DIAGNOSIS — M48062 Spinal stenosis, lumbar region with neurogenic claudication: Secondary | ICD-10-CM | POA: Diagnosis not present

## 2024-11-09 DIAGNOSIS — Z79891 Long term (current) use of opiate analgesic: Secondary | ICD-10-CM | POA: Diagnosis not present

## 2024-11-10 DIAGNOSIS — M0579 Rheumatoid arthritis with rheumatoid factor of multiple sites without organ or systems involvement: Secondary | ICD-10-CM | POA: Diagnosis not present

## 2024-12-21 DIAGNOSIS — M79643 Pain in unspecified hand: Secondary | ICD-10-CM | POA: Diagnosis not present

## 2024-12-21 DIAGNOSIS — M1990 Unspecified osteoarthritis, unspecified site: Secondary | ICD-10-CM | POA: Diagnosis not present

## 2024-12-21 DIAGNOSIS — Z79899 Other long term (current) drug therapy: Secondary | ICD-10-CM | POA: Diagnosis not present

## 2024-12-21 DIAGNOSIS — M0579 Rheumatoid arthritis with rheumatoid factor of multiple sites without organ or systems involvement: Secondary | ICD-10-CM | POA: Diagnosis not present

## 2025-01-18 ENCOUNTER — Encounter: Payer: Self-pay | Admitting: Physician Assistant

## 2025-01-18 ENCOUNTER — Ambulatory Visit: Admitting: Physician Assistant

## 2025-01-18 DIAGNOSIS — F3342 Major depressive disorder, recurrent, in full remission: Secondary | ICD-10-CM | POA: Diagnosis not present

## 2025-01-18 DIAGNOSIS — F5101 Primary insomnia: Secondary | ICD-10-CM

## 2025-01-18 DIAGNOSIS — F419 Anxiety disorder, unspecified: Secondary | ICD-10-CM | POA: Diagnosis not present

## 2025-01-18 MED ORDER — LORAZEPAM 0.5 MG PO TABS: ORAL_TABLET | ORAL | 1 refills | Status: AC

## 2025-01-18 NOTE — Progress Notes (Signed)
 "     [facility] Check  Patient ID: Susan Castillo,  MRN: [id number]  PCP: Verdia Lombard, MD  Date of Evaluation: 01/18/2025 Time spent:20 minutes  Chief Complaint:  Chief Complaint   Anxiety; Depression; Insomnia; Follow-up    HISTORY/CURRENT STATUS: HPI  For 9 month med check.   She's doing well as far as her mental health goes.  It was tough when she was in so much pain (see ROS) but now feels like her normal self.  Energy and motivation are good.  Work is going well.   No extreme sadness, tearfulness, or feelings of hopelessness.  Sleeps well most of the time.  ADLs and personal hygiene are normal.   No changes in memory.  Appetite has not changed.  Weight is stable.  Anxiety is well controlled.  Takes Ativan  prn and it is effective.   No mania, delirium, AH/VH.  No SI/HI.  Individual Medical History/ Review of Systems: Changes? :Yes   spinal cord stimulator quit working in Oct, did a replacement 01/05/2025. Was in pain for those 3 months, and only able to take Tylenol  Past Medication Trials: Cymbalta -effective Concerta Abilify  Trazodone  Rexulti Lorazepam   Allergies: Codeine  Current Medications:  Current Outpatient Medications:    Albuterol -Budesonide  (AIRSUPRA ) 90-80 MCG/ACT AERO, Inhale 2 puffs into the lungs as needed (every 6 hours for cough, wheeze, shortness of breath.  Rinse, gargle, and spit after use)., Disp: 10.7 g, Rfl: 1   alendronate (FOSAMAX) 70 MG tablet, Take 70 mg by mouth once a week., Disp: , Rfl:    ARIPiprazole  (ABILIFY ) 5 MG tablet, Take 1 tablet (5 mg total) by mouth daily., Disp: 90 tablet, Rfl: 3   Calcium Citrate-Vitamin D (CALCIUM + D PO), Take by mouth., Disp: , Rfl:    cyproheptadine  (PERIACTIN ) 4 MG tablet, TAKE 1 & 1/2 TABLETS BY MOUTH EVERY DAY AT BEDTIME, Disp: 135 tablet, Rfl: 3   diclofenac (VOLTAREN) 75 MG EC tablet, Take 75 mg by mouth 2 (two) times daily as needed., Disp: , Rfl:    DULoxetine  (CYMBALTA ) 30 MG capsule, Take 1  capsule (30 mg total) by mouth daily., Disp: 90 capsule, Rfl: 3   DULoxetine  (CYMBALTA ) 60 MG capsule, Take 1 capsule (60 mg total) by mouth every morning., Disp: 90 capsule, Rfl: 3   fluticasone -salmeterol (WIXELA INHUB) 250-50 MCG/ACT AEPB, Inhale one puff one to two times daily to prevent cough or wheeze. Rinse, gargle, and spit after use., Disp: 60 each, Rfl: 11   golimumab  (SIMPONI  ARIA) 50 MG/4ML SOLN injection, Inject 50 mg into the vein every 8 (eight) weeks., Disp: , Rfl:    hydroxychloroquine (PLAQUENIL) 200 MG tablet, Take 400 mg by mouth daily., Disp: , Rfl:    latanoprost (XALATAN) 0.005 % ophthalmic solution, INT 1 GTT IN OU Q NIGHT UTD, Disp: , Rfl: 11   metFORMIN (GLUCOPHAGE-XR) 500 MG 24 hr tablet, SMARTSIG:1 Tablet(s) By Mouth Every Evening, Disp: , Rfl:    mometasone  (NASONEX ) 50 MCG/ACT nasal spray, Use one to two sprays in each nostril once daily as directed., Disp: 17 g, Rfl: 11   montelukast  (SINGULAIR ) 10 MG tablet, TAKE 1 TABLET BY MOUTH EVERYDAY AT BEDTIME, Disp: 90 tablet, Rfl: 3   Multiple Vitamins-Minerals (MULTIVITAMIN ADULT PO), Take by mouth., Disp: , Rfl:    rosuvastatin (CRESTOR) 20 MG tablet, , Disp: , Rfl:    SODIUM FLUORIDE 5000 PPM 1.1 % GEL dental gel, 2 (two) times daily., Disp: , Rfl:    sulfaSALAzine (  AZULFIDINE) 500 MG tablet, , Disp: , Rfl:    traZODone  (DESYREL ) 50 MG tablet, Take 1 tablet (50 mg total) by mouth at bedtime., Disp: 90 tablet, Rfl: 3   LORazepam  (ATIVAN ) 0.5 MG tablet, TAKE 1/2-2 TABLETS EVERY 6 HOURS AS NEEDED FOR ANXIETY AND PANIC, Disp: 90 tablet, Rfl: 1   predniSONE (DELTASONE) 1 MG tablet, Take 2 mg by mouth daily. (Patient not taking: Reported on 01/18/2025), Disp: , Rfl:  Medication Side Effects: none  Family Medical/ Social History: Changes? no  MENTAL HEALTH EXAM:  There were no vitals taken for this visit.There is no height or weight on file to calculate BMI.  General Appearance: Casual and Well Groomed  Eye Contact:  Good   Speech:  Clear and Coherent and Normal Rate  Volume:  Normal  Mood:  Euthymic  Affect:  Congruent  Thought Process:  Goal Directed and Descriptions of Associations: Circumstantial  Orientation:  Full (Time, Place, and Person)  Thought Content: Logical   Suicidal Thoughts:  No  Homicidal Thoughts:  No  Memory:  WNL  Judgement:  Good  Insight:  Good  Psychomotor Activity:  Normal  Concentration:  Concentration: Good  Recall:  Good  Fund of Knowledge: Good  Language: Good  Assets:  Communication Skills Desire for Improvement Financial Resources/Insurance Housing Transportation Vocational/Educational  ADL's:  Intact  Cognition: WNL  Prognosis:  Good   Her PCP and rheumatologist follow her labs.  DIAGNOSES:    ICD-10-CM   1. Major depression, recurrent, full remission  F33.42     2. Anxiety disorder, unspecified type  F41.9 LORazepam  (ATIVAN ) 0.5 MG tablet    3. Primary insomnia  F51.01       Receiving Psychotherapy: No   RECOMMENDATIONS:   PDMP reviewed.  Ativan  filled 12/14/2025 I provided approximately  20 minutes of face to face time during this encounter, including time spent before and after the visit in records review, medical decision making, counseling pertinent to today's visit, and charting.   She is doing well on the current medication regimen.  No changes are needed.  Continue Abilify  5 mg, 1 p.o. daily. Continue Cymbalta  30 mg +60 mg daily. Continue Ativan  0.5 mg, 1/2-2 p.o. every 6 hours as needed anxiety or panic. Continue trazodone  50 mg, 1 p.o. nightly. Return in 9 months.  Verneita Cooks, PA-C  "

## 2025-09-13 ENCOUNTER — Ambulatory Visit: Admitting: Allergy and Immunology

## 2025-10-18 ENCOUNTER — Ambulatory Visit: Admitting: Physician Assistant
